# Patient Record
Sex: Male | Born: 1960 | Race: White | Hispanic: No | Marital: Married | Smoking: Former smoker
Health system: Southern US, Community
[De-identification: ages and names within clinical notes are randomized; demographics above are authoritative.]

## PROBLEM LIST (undated history)

## (undated) DIAGNOSIS — F329 Major depressive disorder, single episode, unspecified: Secondary | ICD-10-CM

## (undated) DIAGNOSIS — K279 Peptic ulcer, site unspecified, unspecified as acute or chronic, without hemorrhage or perforation: Secondary | ICD-10-CM

## (undated) DIAGNOSIS — F32A Depression, unspecified: Secondary | ICD-10-CM

## (undated) DIAGNOSIS — R7303 Prediabetes: Secondary | ICD-10-CM

## (undated) DIAGNOSIS — I10 Essential (primary) hypertension: Secondary | ICD-10-CM

## (undated) HISTORY — PX: REPLACEMENT TOTAL KNEE: SUR1224

## (undated) HISTORY — PX: TENDON REPAIR: SHX5111

## (undated) HISTORY — DX: Peptic ulcer, site unspecified, unspecified as acute or chronic, without hemorrhage or perforation: K27.9

## (undated) HISTORY — DX: Prediabetes: R73.03

## (undated) HISTORY — PX: NASAL SEPTUM SURGERY: SHX37

## (undated) HISTORY — PX: COLONOSCOPY: SHX5424

## (undated) HISTORY — DX: Essential (primary) hypertension: I10

---
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
## 2008-05-26 ENCOUNTER — Ambulatory Visit: Payer: Self-pay

## 2008-08-07 ENCOUNTER — Ambulatory Visit: Payer: Self-pay | Admitting: Cardiology

## 2009-01-17 ENCOUNTER — Emergency Department (HOSPITAL_COMMUNITY): Admission: EM | Admit: 2009-01-17 | Discharge: 2009-01-17 | Payer: Self-pay | Admitting: Emergency Medicine

## 2010-04-14 ENCOUNTER — Ambulatory Visit: Payer: Self-pay | Admitting: Surgery

## 2010-04-14 ENCOUNTER — Encounter (INDEPENDENT_AMBULATORY_CARE_PROVIDER_SITE_OTHER): Payer: Self-pay | Admitting: Orthopedic Surgery

## 2010-04-14 ENCOUNTER — Ambulatory Visit (HOSPITAL_COMMUNITY): Admission: RE | Admit: 2010-04-14 | Discharge: 2010-04-14 | Payer: Self-pay | Admitting: Orthopedic Surgery

## 2010-06-07 ENCOUNTER — Ambulatory Visit (HOSPITAL_COMMUNITY): Admission: RE | Admit: 2010-06-07 | Discharge: 2010-06-07 | Payer: Self-pay | Admitting: Orthopedic Surgery

## 2010-10-28 ENCOUNTER — Encounter: Admission: RE | Admit: 2010-10-28 | Payer: Self-pay | Source: Home / Self Care | Admitting: Endocrinology

## 2010-11-05 ENCOUNTER — Encounter
Admission: RE | Admit: 2010-11-05 | Discharge: 2010-11-05 | Payer: Self-pay | Source: Home / Self Care | Attending: Endocrinology | Admitting: Endocrinology

## 2010-11-23 ENCOUNTER — Encounter: Payer: Self-pay | Admitting: Endocrinology

## 2011-03-07 NOTE — Assessment & Plan Note (Signed)
[facility]                            CARDIOLOGY OFFICE NOTE   [name], [name]                        MRN:          [id number]  DATE:08/07/2008                            DOB:          Apr 29, 1961    Jeffrey Daniel is a pleasant 50 year old gentleman who presents for evaluation of  hypertension, hyperlipidemia and positive family history of coronary  artery disease.  Note, he has no prior cardiac history and he has also  had pericardial effusion in the 90s.  He recently underwent an adenosine  Myoview on May 26, 2008, for risk stratification.  His ejection  fraction was 56% and there was no scar or ischemia.  He has a history of  borderline hypertension and borderline hyperlipidemia.  Because of the  above issues, he wanted to be seen for further evaluation.  Note, he has  mild dyspnea with more extreme activities but there is no orthopnea,  PND, pedal edema, chest pain, palpitations, or syncope.  He had not had  claudication.  He quit smoking approximately 5 years ago.  He tries to  exercise occasionally but this does not sound to be routine thing.  He  does not follow a strict diet.   He is on no medications at present other than Tylenol as needed.   ALLERGIES:  Allergy to NONSTEROIDALS by his report.   FAMILY HISTORY:  Significant for his father had myocardial infarction at  age 44.  He also apparently had a maternal grandfather who had  myocardial infarction in his early 72s.   SOCIAL HISTORY:  He has remote history of tobacco use smoking  approximately 30 years, but he has not smoked in the past 5-6 years.  He  occasionally consumes a class of wine of beer.  He is married.  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   PAST MEDICAL HISTORY:  Significant for borderline hypertension by his  report and there has been discussion with his primary care physician  about adding medications in the past.  He also apparently has mild  hyperlipidemia.  He  has had prior nasal surgery and also prior foot  surgery.   REVIEW OF SYSTEMS:  He denies any headaches, fevers, or chills.  There  is no productive cough or hemoptysis.  There is no dysphagia,  odynophagia, melena, or hematochezia.  There is no dysuria or hematuria.  There is no seizure activity.  There is no orthopnea, PND or pedal  edema.  There is no claudication noted.  The remaining systems are  negative.   PHYSICAL EXAMINATION:  VITAL SIGNS:  Today shows a blood pressure of  149/99.  On recheck it was 140/90.  His pulse is 66.  He weighs 237  pounds.  GENERAL:  He is well-developed and mildly obese.  He is in no acute  distress at present.  SKIN:  Warm and dry.  He does not to be depressed.  There is no  peripheral clubbing.  BACK:  Normal.  HEENT:  Normal with normal eyelids.  NECK:  Supple with a normal upstroke bilaterally.  There is  no bruits  noted.  There is no jugular venous distention.  I cannot appreciate  thyromegaly.  CHEST:  Clear to auscultation.  There is no expansion.  CARDIOVASCULAR:  Regular rhythm.  Normal S1 and S2.  There are no  murmurs, rubs, or gallops noted.  There is no change of Valsalva.  ABDOMEN:  Nontender and nondistended.  Positive bowel sounds.  No  hepatosplenomegaly.  No mass appreciated.  There is no abdominal bruit.  EXTREMITIES:  He has 2+ femoral pulses bilaterally.  No bruits.  Extremity showed no edema.  I can palpate no cords.  He has 2+ dorsalis  pedis pulses bilaterally.  NEUROLOGIC:  Grossly intact.   His electrocardiogram shows sinus rhythm at a rate of 66.  There is no  RV conduction delay, but there are no ST changes.  He did have a Myoview  performed on May 26, 2008, showed an ejection fraction of 56% with no  scar or ischemia.   DIAGNOSES:  1. Borderline hypertension - the patient's blood pressure is mildly      elevated today.  We discussed the importance of lifestyle      modification.  This included an exercise  routinely as well as diet      and weight loss.  He does not have an alcohol issue.  He also has      discontinued his tobacco use previously.  After several months of      this if his blood pressure continues to run high then he will need      an antihypertensive.  He will follow up with Dr. Lenise Arena concerning      this issue.  2. Hyperlipidemia -  apparently, he had lipids drawn by Dr. Lenise Arena      recently and we will have that in 42 hours and I will review.  We      also discussed lifestyle modification concerning that issue.  This      including diet, exercise for his HDL.  3. Positive family history of coronary artery disease.  4. History of osteoarthritis.   Again, we discussed significant lifestyle modification included diet and  exercise.  He does not smoke.  I have asked him to follow up with me on  as-needed basis if he develops cardiac symptoms or needs further  management in the future.     Jeffrey Frieze Jens Som, MD, [facility]  Electronically Signed    BSC/MedQ  DD: 08/07/2008  DT: 08/07/2008  Job #: [id number]   cc:   Joycelyn Rua, M.D.

## 2011-07-01 ENCOUNTER — Emergency Department (INDEPENDENT_AMBULATORY_CARE_PROVIDER_SITE_OTHER): Payer: BC Managed Care – PPO

## 2011-07-01 ENCOUNTER — Emergency Department (HOSPITAL_BASED_OUTPATIENT_CLINIC_OR_DEPARTMENT_OTHER)
Admission: EM | Admit: 2011-07-01 | Discharge: 2011-07-01 | Disposition: A | Discharge status: Home / Self Care | Payer: BC Managed Care – PPO | Attending: Emergency Medicine | Admitting: Emergency Medicine

## 2011-07-01 ENCOUNTER — Encounter: Payer: Self-pay | Admitting: *Deleted

## 2011-07-01 DIAGNOSIS — W11XXXA Fall on and from ladder, initial encounter: Secondary | ICD-10-CM

## 2011-07-01 DIAGNOSIS — S2239XA Fracture of one rib, unspecified side, initial encounter for closed fracture: Secondary | ICD-10-CM

## 2011-07-01 DIAGNOSIS — M25519 Pain in unspecified shoulder: Secondary | ICD-10-CM | POA: Insufficient documentation

## 2011-07-01 DIAGNOSIS — R071 Chest pain on breathing: Secondary | ICD-10-CM | POA: Insufficient documentation

## 2011-07-01 DIAGNOSIS — W19XXXA Unspecified fall, initial encounter: Secondary | ICD-10-CM

## 2011-07-01 MED ORDER — OXYCODONE-ACETAMINOPHEN 5-325 MG PO TABS
1.0000 | ORAL_TABLET | Freq: Once | ORAL | Status: AC
  Administered 2011-07-01: 1 via ORAL
  Filled 2011-07-01: qty 1

## 2011-07-01 MED ORDER — HYDROCODONE-ACETAMINOPHEN 5-500 MG PO TABS: 1.0000 | ORAL_TABLET | Freq: Four times a day (QID) | ORAL | Status: AC | PRN

## 2011-07-01 NOTE — ED Notes (Signed)
Pt fell off short ladder landing on left shoulder. C/O pain to same.

## 2011-07-01 NOTE — ED Notes (Signed)
rx x 1 given for vicodin- d/c home with family to drive- ambulatory without need for assist at time of d/c

## 2011-07-01 NOTE — ED Notes (Signed)
Pt was instructed on the use of the IS and performed x 12 at 1200 cc.

## 2011-07-02 NOTE — ED Provider Notes (Signed)
History     CSN: [id number] Arrival date & time: 07/01/2011  8:52 PM  Chief Complaint  Patient presents with  . Fall   HPI Comments: 50yoM previously healthy pw Lt shoulder/back pain. Pt states he was 62ft up on ladder and slipped backward, falling onto shoulder. C/O Lt rib pain. Worse with movement and deep breathing. Denies shortness of breath, chest pain. No BHT/LOC. Denies neck pain/back pain. No numbness/tingling/weakness of extremities. Denies other complaints. Denies prefall headache/syncope/lightheadedness/cp/sob/palpitations/abd pain/n/v. Denies use of anticoagulants   Patient is a 50 y.o. male presenting with fall.  Fall    History reviewed. No pertinent past medical history.  Past Surgical History  Procedure Date  . Tendon repair   . Nasal septum surgery   . Colonoscopy     History reviewed. No pertinent family history.  History  Substance Use Topics  . Smoking status: Former Games developer  . Smokeless tobacco: Not on file  . Alcohol Use: Yes      Review of Systems  All other systems reviewed and are negative.  except as noted HPI   Physical Exam  BP 153/99  Pulse 68  Temp(Src) 99.4 F (37.4 C) (Oral)  Resp 20  Ht 6' (1.829 m)  Wt 240 lb (108.863 kg)  BMI 32.55 kg/m2  SpO2 95%  Physical Exam  Nursing note and vitals reviewed. Constitutional: He is oriented to person, place, and time. He appears well-developed and well-nourished. No distress.  HENT:  Head: Atraumatic.  Mouth/Throat: Oropharynx is clear and moist.  Eyes: Conjunctivae are normal. Pupils are equal, round, and reactive to light.  Neck: Neck supple.       No c/t/l/s ttp  Cardiovascular: Normal rate, regular rhythm, normal heart sounds and intact distal pulses.  Exam reveals no gallop and no friction rub.   No murmur heard. Pulmonary/Chest: Effort normal and breath sounds normal. No respiratory distress. He has no wheezes. He has no rales. He exhibits tenderness.       BS equal b/l    Abdominal: Soft. Bowel sounds are normal. There is no tenderness. There is no rebound and no guarding.  Musculoskeletal: Normal range of motion. He exhibits no edema and no tenderness.       +Lt posterior rib ttp, no ecchymosis  Neurological: He is alert and oriented to person, place, and time. No cranial nerve deficit. He exhibits normal muscle tone. Coordination normal.       Strength 5/5 all extremities No pronator drift No facial droop   Skin: Skin is warm and dry.  Psychiatric: He has a normal mood and affect.    ED Course  Procedures  MDM S/p mechanical fall with rib fracture. No ptx. Pain control. Incentive spirometry. Home with pmd f/u prn  Stefano Gaul, MD       Forbes Cellar, MD 07/02/11 [phone]

## 2011-07-25 ENCOUNTER — Ambulatory Visit (HOSPITAL_COMMUNITY): Payer: BC Managed Care – PPO | Attending: Orthopedic Surgery

## 2011-07-25 DIAGNOSIS — D649 Anemia, unspecified: Secondary | ICD-10-CM | POA: Insufficient documentation

## 2013-02-20 ENCOUNTER — Encounter: Payer: Self-pay | Admitting: *Deleted

## 2013-02-20 ENCOUNTER — Encounter: Payer: Self-pay | Admitting: Cardiology

## 2013-02-21 ENCOUNTER — Ambulatory Visit (INDEPENDENT_AMBULATORY_CARE_PROVIDER_SITE_OTHER): Payer: BC Managed Care – PPO | Admitting: Cardiology

## 2013-02-21 ENCOUNTER — Encounter: Payer: Self-pay | Admitting: Cardiology

## 2013-02-21 VITALS — BP 133/90 | HR 63 | Wt 253.0 lb

## 2013-02-21 DIAGNOSIS — I10 Essential (primary) hypertension: Secondary | ICD-10-CM | POA: Insufficient documentation

## 2013-02-21 DIAGNOSIS — R002 Palpitations: Secondary | ICD-10-CM

## 2013-02-21 DIAGNOSIS — R079 Chest pain, unspecified: Secondary | ICD-10-CM

## 2013-02-21 NOTE — Assessment & Plan Note (Signed)
Continue present medications. 

## 2013-02-21 NOTE — Assessment & Plan Note (Signed)
Description sounds like PACs or PVCs. Schedule echocardiogram, TSH and potassium. He is also complaining of fatigue and I will check his hemoglobin. If his symptoms worsen we'll schedule monitor.

## 2013-02-21 NOTE — Addendum Note (Signed)
Addended by: Freddi Starr on: 02/21/2013 10:19 AM   Modules accepted: Orders

## 2013-02-21 NOTE — Patient Instructions (Addendum)
Your physician wants you to follow-up in: 6 MONTHS WITH DR Jens Som You will receive a reminder letter in the mail two months in advance. If you don't receive a letter, please call our office to schedule the follow-up appointment.   Your physician has requested that you have an echocardiogram. Echocardiography is a painless test that uses sound waves to create images of your heart. It provides your doctor with information about the size and shape of your heart and how well your heart's chambers and valves are working. This procedure takes approximately one hour. There are no restrictions for this procedure.   Your physician has requested that you have an exercise tolerance test. For further information please visit [email or web address] Please also follow instruction sheet, as given.   Exercise Stress Electrocardiography An exercise stress test is a heart test (EKG) which is done while you are moving. You will walk on a treadmill. This test will tell your doctor how your heart does when it is forced to work harder and how much activity you can safely handle. BEFORE THE TEST  Wear shorts or athletic pants.  Wear comfortable tennis shoes.  Women need to wear a bra that allows patches to be put on under it. TEST  An EKG cable will be attached to your waist. This cable is hooked up to patches, which look like round stickers stuck to your chest.  You will be asked to walk on the treadmill.  You will walk until you are too tired or until you are told to stop.  Tell the doctor right away if you have:  Chest pain.  Leg cramps.  Shortness of breath.  Dizziness.  The test may last 30 minutes to 1 hour. The timing depends on your physical condition and the condition of your heart. AFTER THE TEST  You will rest for about 6 minutes. During this time, your heart rhythm and blood pressure will be checked.  The testing equipment will be removed from your body and you can get dressed.  You may  go home or back to your hospital room. You may keep doing all your usual activities as told by your doctor. Finding out the results of your test Ask when your test results will be ready. Make sure you get your test results. Document Released: 03/27/2008 Document Revised: 01/01/2012 Document Reviewed: 03/27/2008 [facility] Patient Information 2013 [place], [place].

## 2013-02-21 NOTE — Assessment & Plan Note (Signed)
Plan exercise treadmill. Symptoms atypical.

## 2013-02-21 NOTE — Progress Notes (Signed)
  HPI: Pleasant male for evaluation of chest pain. Myoview in August of 2009 showed an ejection fraction of 56% and no scar or ischemia. patient states that for the past year he has had occasional pain in the left breast area and described as a cramp. It can last up to one hour. No associated symptoms. The pain is not pleuritic, positional, exertional or related to food. It improves with taking a deep breath in sitting up. He has not had dyspnea on exertion. No syncope. Patient also has had occasional palpitations scribed as a skip. Not sustained.  Current Outpatient Prescriptions  Medication Sig Dispense Refill  . acetaminophen (TYLENOL) 325 MG tablet Take 650 mg by mouth every 6 (six) hours as needed for pain.      Marland Kitchen aspirin EC 81 MG tablet Take 81 mg by mouth daily.        . Cholecalciferol (VITAMIN D) 2000 UNITS CAPS Take 2,000 Units by mouth 2 (two) times daily.        Marland Kitchen lisinopril-hydrochlorothiazide (PRINZIDE,ZESTORETIC) 20-12.5 MG per tablet Take 1 tablet by mouth daily.      . Red Yeast Rice 600 MG CAPS Take 1 capsule by mouth every evening.        . sodium chloride (OCEAN) 0.65 % nasal spray Place 2 sprays into the nose as needed. allergies       . Testosterone (ANDROGEL PUMP) 1.25 GM/ACT (1%) GEL Place 1 Squirt onto the skin daily.         No current facility-administered medications for this visit.    Allergies  Allergen Reactions  . Nsaids Hives, Shortness Of Breath and Itching    Past Medical History  Diagnosis Date  . Hypertension     Past Surgical History  Procedure Laterality Date  . Tendon repair    . Nasal septum surgery    . Colonoscopy      History   Social History  . Marital Status: Married    Spouse Name: N/A    Number of Children: 2  . Years of Education: N/A   Occupational History  .     Social History Main Topics  . Smoking status: Former Games developer  . Smokeless tobacco: Not on file  . Alcohol Use: Yes     Comment: Occasional  . Drug Use: No  .  Sexually Active: Not on file   Other Topics Concern  . Not on file   Social History Narrative  . No narrative on file    Family History  Problem Relation Age of Onset  . CAD Father     MI at age 59  . Heart disease Son     Pulmonary atresia  . Stroke Mother     ROS: fatigue no fevers or chills, productive cough, hemoptysis, dysphasia, odynophagia, melena, hematochezia, dysuria, hematuria, rash, seizure activity, orthopnea, PND, pedal edema, claudication. Remaining systems are negative.  Physical Exam:   Blood pressure 133/90, pulse 63, weight 253 lb (114.76 kg).  General:  Well developed/well nourished in NAD Skin warm/dry Patient not depressed No peripheral clubbing Back-normal HEENT-normal/normal eyelids Neck supple/normal carotid upstroke bilaterally; no bruits; no JVD; no thyromegaly chest - CTA/ normal expansion CV - RRR/normal S1 and S2; no murmurs, rubs or gallops;  PMI nondisplaced Abdomen -NT/ND, no HSM, no mass, + bowel sounds, no bruit 2+ femoral pulses, no bruits Ext-no edema, chords, 2+ DP Neuro-grossly nonfocal  ECG NSR with no ST changes

## 2013-02-27 ENCOUNTER — Other Ambulatory Visit (INDEPENDENT_AMBULATORY_CARE_PROVIDER_SITE_OTHER): Payer: BC Managed Care – PPO

## 2013-02-27 ENCOUNTER — Ambulatory Visit (HOSPITAL_COMMUNITY): Payer: BC Managed Care – PPO | Attending: Cardiology | Admitting: Radiology

## 2013-02-27 ENCOUNTER — Other Ambulatory Visit: Payer: Self-pay

## 2013-02-27 DIAGNOSIS — E669 Obesity, unspecified: Secondary | ICD-10-CM | POA: Insufficient documentation

## 2013-02-27 DIAGNOSIS — R079 Chest pain, unspecified: Secondary | ICD-10-CM

## 2013-02-27 DIAGNOSIS — R0609 Other forms of dyspnea: Secondary | ICD-10-CM

## 2013-02-27 DIAGNOSIS — I10 Essential (primary) hypertension: Secondary | ICD-10-CM | POA: Insufficient documentation

## 2013-02-27 DIAGNOSIS — R072 Precordial pain: Secondary | ICD-10-CM

## 2013-02-27 DIAGNOSIS — R002 Palpitations: Secondary | ICD-10-CM

## 2013-02-27 LAB — BASIC METABOLIC PANEL
BUN: 24 mg/dL — ABNORMAL HIGH (ref 6–23)
CO2: 28 mEq/L (ref 19–32)
Chloride: 103 mEq/L (ref 96–112)
Glucose, Bld: 93 mg/dL (ref 70–99)
Potassium: 4.1 mEq/L (ref 3.5–5.1)
Sodium: 138 mEq/L (ref 135–145)

## 2013-02-27 LAB — CBC WITH DIFFERENTIAL/PLATELET
Basophils Absolute: 0 10*3/uL (ref 0.0–0.1)
HCT: 45.3 % (ref 39.0–52.0)
Hemoglobin: 15.5 g/dL (ref 13.0–17.0)
Lymphs Abs: 1.6 10*3/uL (ref 0.7–4.0)
MCHC: 34.1 g/dL (ref 30.0–36.0)
MCV: 89.7 fl (ref 78.0–100.0)
Monocytes Absolute: 0.6 10*3/uL (ref 0.1–1.0)
Neutro Abs: 3.9 10*3/uL (ref 1.4–7.7)
Platelets: 157 10*3/uL (ref 150.0–400.0)
RDW: 13.5 % (ref 11.5–14.6)

## 2013-02-27 LAB — TSH: TSH: 0.94 u[IU]/mL (ref 0.35–5.50)

## 2013-02-27 NOTE — Progress Notes (Signed)
Echocardiogram performed.  

## 2013-02-28 ENCOUNTER — Telehealth: Payer: Self-pay | Admitting: Cardiology

## 2013-02-28 NOTE — Telephone Encounter (Signed)
CD of Echo mailed to Pt, ROI signed  02/28/13/KM

## 2013-03-10 ENCOUNTER — Ambulatory Visit (INDEPENDENT_AMBULATORY_CARE_PROVIDER_SITE_OTHER): Payer: BC Managed Care – PPO | Admitting: Physician Assistant

## 2013-03-10 DIAGNOSIS — R079 Chest pain, unspecified: Secondary | ICD-10-CM

## 2013-03-10 DIAGNOSIS — R002 Palpitations: Secondary | ICD-10-CM

## 2013-03-10 NOTE — Progress Notes (Signed)
Exercise Treadmill Test  Pre-Exercise Testing Evaluation Rhythm: normal sinus  Rate: 68                 Test  Exercise Tolerance Test Ordering MD: Olga Millers, MD  Interpreting MD: Tereso Newcomer, PA-C  Unique Test No: 1  Treadmill:  1  Indication for ETT: chest pain - rule out ischemia  Contraindication to ETT: No   Stress Modality: exercise - treadmill  Cardiac Imaging Performed: non   Protocol: standard Bruce - maximal  Max BP:  160/68  Max MPHR (bpm):  167 85% MPR (bpm):  142  MPHR obtained (bpm):  151 % MPHR obtained:  90%  Reached 85% MPHR (min:sec):  7:31 Total Exercise Time (min-sec):  9:00  Workload in METS:  10.1 Borg Scale: 16  Reason ETT Terminated:  desired heart rate attained    ST Segment Analysis At Rest: normal ST segments - no evidence of significant ST depression With Exercise: non-specific ST changes  Other Information Arrhythmia:  ventricular couplet x 2 at max exercise Angina during ETT:  absent (0) Quality of ETT:  diagnostic  ETT Interpretation:  normal - no evidence of ischemia by ST analysis  Comments: Good exercise tolerance. No chest pain. Normal BP response to exercise. Insignificant up-sloping ST depression. No changes to suggest ischemia. Two ventricular couplets during exercise noted. Asymptomatic.    Recommendations: F/u with Dr. Olga Millers as directed. Luna Glasgow, PA-C  9:58 AM 03/10/2013

## 2013-03-14 ENCOUNTER — Telehealth: Payer: Self-pay | Admitting: *Deleted

## 2013-03-14 NOTE — Telephone Encounter (Signed)
Spoke with pt, aware of echo, gxt and lab results. He would like for dr Jens Som to compare the recent gxt with his nuclear test he had in the past. Requested the pts paper chart from medical records.

## 2013-03-20 NOTE — Telephone Encounter (Signed)
Left message for pt, dr Jens Som has reviewed all the strips from the recent gxt and myoview from 2009, everything is normal. He will call with further questions.

## 2013-07-18 ENCOUNTER — Other Ambulatory Visit: Payer: Self-pay | Admitting: Orthopedic Surgery

## 2013-07-18 DIAGNOSIS — M25561 Pain in right knee: Secondary | ICD-10-CM

## 2013-08-04 ENCOUNTER — Ambulatory Visit
Admission: RE | Admit: 2013-08-04 | Discharge: 2013-08-04 | Disposition: A | Discharge status: Home / Self Care | Payer: BC Managed Care – PPO | Source: Ambulatory Visit | Attending: Orthopedic Surgery | Admitting: Orthopedic Surgery

## 2013-08-04 DIAGNOSIS — M25561 Pain in right knee: Secondary | ICD-10-CM

## 2014-02-03 ENCOUNTER — Telehealth: Payer: Self-pay | Admitting: Cardiology

## 2014-02-03 NOTE — Telephone Encounter (Signed)
New message     Pt had a stress test with Scott.  He thinks he needs to have a follow up appt.  Is a follow up appt or further testing required?

## 2014-02-03 NOTE — Telephone Encounter (Signed)
Spoke with pt, he did not have a follow up after testing because everything was normal. His insurance has told him he needs a follow up appt He would like to see scott weaver pa again Follow up scheduled

## 2014-02-18 ENCOUNTER — Encounter (INDEPENDENT_AMBULATORY_CARE_PROVIDER_SITE_OTHER): Payer: Self-pay

## 2014-02-18 ENCOUNTER — Encounter: Payer: Self-pay | Admitting: Physician Assistant

## 2014-02-18 ENCOUNTER — Ambulatory Visit (INDEPENDENT_AMBULATORY_CARE_PROVIDER_SITE_OTHER): Payer: BC Managed Care – PPO | Admitting: Physician Assistant

## 2014-02-18 VITALS — BP 110/80 | HR 65 | Ht 72.0 in | Wt 252.0 lb

## 2014-02-18 DIAGNOSIS — R079 Chest pain, unspecified: Secondary | ICD-10-CM

## 2014-02-18 DIAGNOSIS — I10 Essential (primary) hypertension: Secondary | ICD-10-CM

## 2014-02-18 NOTE — Progress Notes (Signed)
[address], [address], [place]  [id number] Phone: [phone] Fax:  [phone]  Date:  02/18/2014   ID:  Jeffrey Daniel, DOB 12/09/1960, MRN [id number]  PCP:  No primary provider on file.  Cardiologist:  Dr. Kirk Ruths     History of Present Illness: Jeffrey Daniel is a 53 y.o. male with a history of hypertension. He was evaluated by Dr. Stanford Breed in 02/2013 for chest pain. Echocardiogram demonstrated normal LV function with moderate diastolic dysfunction. Exercise treadmill test was negative for ischemic changes.  He returns for followup.  The patient denies chest pain, shortness of breath, syncope, orthopnea, PND or significant pedal edema.    Studies:    - Echo (02/27/13):  Mild focal basal hypertrophy of the septum, EF 55-60%, normal wall motion, grade 2 diastolic dysfunction, mild LAE  - ETT (03/10/13):  No ischemia   Recent Labs: 02/27/2013: Creatinine 1.1; Hemoglobin 15.5; Potassium 4.1; TSH 0.94   Wt Readings from Last 3 Encounters:  02/18/14 252 lb (114.306 kg)  02/21/13 253 lb (114.76 kg)  07/01/11 240 lb (108.863 kg)     Past Medical History  Diagnosis Date  . Hypertension     Current Outpatient Prescriptions  Medication Sig Dispense Refill  . acetaminophen (TYLENOL) 325 MG tablet Take 650 mg by mouth every 6 (six) hours as needed for pain.      Marland Kitchen aspirin EC 81 MG tablet Take 81 mg by mouth daily.        . Cholecalciferol (VITAMIN D) 2000 UNITS CAPS Take 2,000 Units by mouth 2 (two) times daily.        Marland Kitchen lisinopril-hydrochlorothiazide (PRINZIDE,ZESTORETIC) 20-12.5 MG per tablet Take 1 tablet by mouth daily.      . MELOXICAM PO Take by mouth as needed.      . sodium chloride (OCEAN) 0.65 % nasal spray Place 2 sprays into the nose as needed. allergies       . Red Yeast Rice 600 MG CAPS Take 1 capsule by mouth every evening.        . Testosterone (ANDROGEL PUMP) 1.25 GM/ACT (1%) GEL Place 1 Squirt onto the skin daily.         No current  facility-administered medications for this visit.    Allergies:   Nsaids   Social History:  The patient  reports that he has quit smoking. He does not have any smokeless tobacco history on file. He reports that he drinks alcohol. He reports that he does not use illicit drugs.   Family History:  The patient's family history includes CAD in his father; Heart disease in his son; Stroke in his mother.   ROS:  Please see the history of present illness.      All other systems reviewed and negative.   PHYSICAL EXAM: VS:  BP 110/80  Pulse 65  Ht 6' (1.829 m)  Wt 252 lb (114.306 kg)  BMI 34.17 kg/m2 Well nourished, well developed, in no acute distress HEENT: normal Neck: no JVD Vascular: No carotid bruits bilaterally Cardiac:  normal S1, S2; RRR; no murmur Lungs:  clear to auscultation bilaterally, no wheezing, rhonchi or rales Abd: soft, nontender, no hepatomegaly Ext: no edema Skin: warm and dry Neuro:  CNs 2-12 intact, no focal abnormalities noted  EKG:  NSR, HR 65, normal axis, no ST changes     ASSESSMENT AND PLAN:  1. Chest pain:  No recurrence. Echocardiogram in 02/2013 was normal with a normal EF. ETT was negative for  ischemic changes. He has a family history of CAD. He should likely undergo stress testing every 2-3 years for risk stratification. 2. Essential hypertension: Controlled. 3. Disposition: Followup with Dr. Stanford Breed in one year.  Signed, Richardson Dopp, PA-C  02/18/2014 4:06 PM

## 2014-02-18 NOTE — Patient Instructions (Signed)
Your physician wants you to follow-up in: 1 YEAR WITH DR. CRENSHAW You will receive a reminder letter in the mail two months in advance. If you don't receive a letter, please call our office to schedule the follow-up appointment.  

## 2014-02-19 ENCOUNTER — Encounter: Payer: Self-pay | Admitting: Cardiology

## 2014-03-03 ENCOUNTER — Telehealth: Payer: Self-pay | Admitting: Cardiology

## 2014-03-03 NOTE — Telephone Encounter (Signed)
New Message:  Pt states he is reviewing his medical records and states he does not take Androgel or red yeast rice and he wants his medications updated. Pt also wants a call back from the nurse.Marland Kitchen He also wants to know if it is possible the nurse email him an updated copy of his med list so he can review it.   Lynann [email or web address]

## 2014-03-03 NOTE — Telephone Encounter (Signed)
Spoke with pt, medication list updated Copy mailed to pt at his request

## 2016-10-26 ENCOUNTER — Encounter: Payer: Self-pay | Admitting: Surgery

## 2016-10-26 DIAGNOSIS — R1032 Left lower quadrant pain: Secondary | ICD-10-CM | POA: Diagnosis not present

## 2016-11-03 DIAGNOSIS — M5442 Lumbago with sciatica, left side: Secondary | ICD-10-CM | POA: Diagnosis not present

## 2016-11-03 DIAGNOSIS — M25552 Pain in left hip: Secondary | ICD-10-CM | POA: Diagnosis not present

## 2016-11-03 DIAGNOSIS — M5441 Lumbago with sciatica, right side: Secondary | ICD-10-CM | POA: Diagnosis not present

## 2016-11-16 DIAGNOSIS — G8929 Other chronic pain: Secondary | ICD-10-CM | POA: Diagnosis not present

## 2016-11-16 DIAGNOSIS — M5441 Lumbago with sciatica, right side: Secondary | ICD-10-CM | POA: Diagnosis not present

## 2016-11-17 DIAGNOSIS — G8929 Other chronic pain: Secondary | ICD-10-CM | POA: Diagnosis not present

## 2016-11-17 DIAGNOSIS — M5441 Lumbago with sciatica, right side: Secondary | ICD-10-CM | POA: Diagnosis not present

## 2016-12-21 DIAGNOSIS — D49512 Neoplasm of unspecified behavior of left kidney: Secondary | ICD-10-CM | POA: Diagnosis not present

## 2016-12-21 DIAGNOSIS — D49511 Neoplasm of unspecified behavior of right kidney: Secondary | ICD-10-CM | POA: Diagnosis not present

## 2017-02-02 DIAGNOSIS — Z96651 Presence of right artificial knee joint: Secondary | ICD-10-CM | POA: Diagnosis not present

## 2017-02-02 DIAGNOSIS — Z471 Aftercare following joint replacement surgery: Secondary | ICD-10-CM | POA: Diagnosis not present

## 2017-02-02 DIAGNOSIS — M25561 Pain in right knee: Secondary | ICD-10-CM | POA: Diagnosis not present

## 2017-05-23 DIAGNOSIS — M79672 Pain in left foot: Secondary | ICD-10-CM | POA: Diagnosis not present

## 2017-05-23 DIAGNOSIS — M25571 Pain in right ankle and joints of right foot: Secondary | ICD-10-CM | POA: Diagnosis not present

## 2017-05-23 DIAGNOSIS — M79671 Pain in right foot: Secondary | ICD-10-CM | POA: Diagnosis not present

## 2017-05-30 DIAGNOSIS — L814 Other melanin hyperpigmentation: Secondary | ICD-10-CM | POA: Diagnosis not present

## 2017-05-30 DIAGNOSIS — L820 Inflamed seborrheic keratosis: Secondary | ICD-10-CM | POA: Diagnosis not present

## 2017-05-30 DIAGNOSIS — D225 Melanocytic nevi of trunk: Secondary | ICD-10-CM | POA: Diagnosis not present

## 2017-05-30 DIAGNOSIS — L821 Other seborrheic keratosis: Secondary | ICD-10-CM | POA: Diagnosis not present

## 2017-06-27 DIAGNOSIS — M25571 Pain in right ankle and joints of right foot: Secondary | ICD-10-CM | POA: Diagnosis not present

## 2017-06-27 DIAGNOSIS — M7671 Peroneal tendinitis, right leg: Secondary | ICD-10-CM | POA: Diagnosis not present

## 2017-07-02 DIAGNOSIS — Z0000 Encounter for general adult medical examination without abnormal findings: Secondary | ICD-10-CM | POA: Diagnosis not present

## 2017-07-02 DIAGNOSIS — E7800 Pure hypercholesterolemia, unspecified: Secondary | ICD-10-CM | POA: Diagnosis not present

## 2017-07-02 DIAGNOSIS — I10 Essential (primary) hypertension: Secondary | ICD-10-CM | POA: Diagnosis not present

## 2017-07-16 ENCOUNTER — Other Ambulatory Visit: Payer: Self-pay | Admitting: Podiatry

## 2017-07-16 DIAGNOSIS — M7671 Peroneal tendinitis, right leg: Secondary | ICD-10-CM

## 2017-07-18 ENCOUNTER — Ambulatory Visit
Admission: RE | Admit: 2017-07-18 | Discharge: 2017-07-18 | Disposition: A | Discharge status: Home / Self Care | Payer: 59 | Source: Ambulatory Visit | Attending: Podiatry | Admitting: Podiatry

## 2017-07-18 DIAGNOSIS — M7671 Peroneal tendinitis, right leg: Secondary | ICD-10-CM

## 2017-07-18 DIAGNOSIS — S96911A Strain of unspecified muscle and tendon at ankle and foot level, right foot, initial encounter: Secondary | ICD-10-CM | POA: Diagnosis not present

## 2017-08-06 DIAGNOSIS — M722 Plantar fascial fibromatosis: Secondary | ICD-10-CM | POA: Diagnosis not present

## 2017-08-10 DIAGNOSIS — M79671 Pain in right foot: Secondary | ICD-10-CM | POA: Diagnosis not present

## 2017-08-10 DIAGNOSIS — M722 Plantar fascial fibromatosis: Secondary | ICD-10-CM | POA: Diagnosis not present

## 2017-08-10 DIAGNOSIS — M7671 Peroneal tendinitis, right leg: Secondary | ICD-10-CM | POA: Diagnosis not present

## 2017-08-10 DIAGNOSIS — M79672 Pain in left foot: Secondary | ICD-10-CM | POA: Diagnosis not present

## 2017-09-07 DIAGNOSIS — R2689 Other abnormalities of gait and mobility: Secondary | ICD-10-CM | POA: Diagnosis not present

## 2017-09-24 DIAGNOSIS — M79672 Pain in left foot: Secondary | ICD-10-CM | POA: Diagnosis not present

## 2017-09-24 DIAGNOSIS — M79671 Pain in right foot: Secondary | ICD-10-CM | POA: Diagnosis not present

## 2017-09-24 DIAGNOSIS — M7671 Peroneal tendinitis, right leg: Secondary | ICD-10-CM | POA: Diagnosis not present

## 2017-09-24 DIAGNOSIS — M722 Plantar fascial fibromatosis: Secondary | ICD-10-CM | POA: Diagnosis not present

## 2017-10-10 DIAGNOSIS — M79672 Pain in left foot: Secondary | ICD-10-CM | POA: Diagnosis not present

## 2017-10-10 DIAGNOSIS — M79671 Pain in right foot: Secondary | ICD-10-CM | POA: Diagnosis not present

## 2017-10-10 DIAGNOSIS — M722 Plantar fascial fibromatosis: Secondary | ICD-10-CM | POA: Diagnosis not present

## 2017-12-10 DIAGNOSIS — M79672 Pain in left foot: Secondary | ICD-10-CM | POA: Diagnosis not present

## 2017-12-10 DIAGNOSIS — M722 Plantar fascial fibromatosis: Secondary | ICD-10-CM | POA: Diagnosis not present

## 2017-12-10 DIAGNOSIS — M7671 Peroneal tendinitis, right leg: Secondary | ICD-10-CM | POA: Diagnosis not present

## 2017-12-10 DIAGNOSIS — M79671 Pain in right foot: Secondary | ICD-10-CM | POA: Diagnosis not present

## 2018-01-03 DIAGNOSIS — M79672 Pain in left foot: Secondary | ICD-10-CM | POA: Diagnosis not present

## 2018-01-03 DIAGNOSIS — M79671 Pain in right foot: Secondary | ICD-10-CM | POA: Diagnosis not present

## 2018-01-03 DIAGNOSIS — M722 Plantar fascial fibromatosis: Secondary | ICD-10-CM | POA: Diagnosis not present

## 2018-01-11 DIAGNOSIS — D49512 Neoplasm of unspecified behavior of left kidney: Secondary | ICD-10-CM | POA: Diagnosis not present

## 2018-01-11 DIAGNOSIS — D49511 Neoplasm of unspecified behavior of right kidney: Secondary | ICD-10-CM | POA: Diagnosis not present

## 2018-07-05 DIAGNOSIS — Z125 Encounter for screening for malignant neoplasm of prostate: Secondary | ICD-10-CM | POA: Diagnosis not present

## 2018-07-05 DIAGNOSIS — I10 Essential (primary) hypertension: Secondary | ICD-10-CM | POA: Diagnosis not present

## 2018-07-05 DIAGNOSIS — E7800 Pure hypercholesterolemia, unspecified: Secondary | ICD-10-CM | POA: Diagnosis not present

## 2018-07-05 DIAGNOSIS — Z0000 Encounter for general adult medical examination without abnormal findings: Secondary | ICD-10-CM | POA: Diagnosis not present

## 2018-09-16 DIAGNOSIS — L820 Inflamed seborrheic keratosis: Secondary | ICD-10-CM | POA: Diagnosis not present

## 2018-09-16 DIAGNOSIS — L989 Disorder of the skin and subcutaneous tissue, unspecified: Secondary | ICD-10-CM | POA: Diagnosis not present

## 2018-09-16 DIAGNOSIS — D485 Neoplasm of uncertain behavior of skin: Secondary | ICD-10-CM | POA: Diagnosis not present

## 2018-10-24 DIAGNOSIS — J069 Acute upper respiratory infection, unspecified: Secondary | ICD-10-CM | POA: Diagnosis not present

## 2019-08-08 ENCOUNTER — Other Ambulatory Visit: Payer: Self-pay

## 2019-08-08 ENCOUNTER — Emergency Department (HOSPITAL_COMMUNITY): Payer: 59

## 2019-08-08 ENCOUNTER — Encounter (HOSPITAL_COMMUNITY): Payer: Self-pay

## 2019-08-08 ENCOUNTER — Emergency Department (HOSPITAL_COMMUNITY)
Admission: EM | Admit: 2019-08-08 | Discharge: 2019-08-08 | Disposition: A | Discharge status: Home / Self Care | Payer: 59 | Attending: Emergency Medicine | Admitting: Emergency Medicine

## 2019-08-08 DIAGNOSIS — Z79899 Other long term (current) drug therapy: Secondary | ICD-10-CM | POA: Insufficient documentation

## 2019-08-08 DIAGNOSIS — N50812 Left testicular pain: Secondary | ICD-10-CM | POA: Insufficient documentation

## 2019-08-08 DIAGNOSIS — Z7982 Long term (current) use of aspirin: Secondary | ICD-10-CM | POA: Insufficient documentation

## 2019-08-08 DIAGNOSIS — Z96651 Presence of right artificial knee joint: Secondary | ICD-10-CM | POA: Diagnosis not present

## 2019-08-08 DIAGNOSIS — I10 Essential (primary) hypertension: Secondary | ICD-10-CM | POA: Insufficient documentation

## 2019-08-08 DIAGNOSIS — Z87891 Personal history of nicotine dependence: Secondary | ICD-10-CM | POA: Insufficient documentation

## 2019-08-08 DIAGNOSIS — R109 Unspecified abdominal pain: Secondary | ICD-10-CM | POA: Insufficient documentation

## 2019-08-08 HISTORY — DX: Depression, unspecified: F32.A

## 2019-08-08 LAB — URINALYSIS, ROUTINE W REFLEX MICROSCOPIC
Bilirubin Urine: NEGATIVE
Glucose, UA: NEGATIVE mg/dL
Hgb urine dipstick: NEGATIVE
Ketones, ur: NEGATIVE mg/dL
Leukocytes,Ua: NEGATIVE
Nitrite: NEGATIVE
Protein, ur: NEGATIVE mg/dL
Specific Gravity, Urine: 1.029 (ref 1.005–1.030)
pH: 5 (ref 5.0–8.0)

## 2019-08-08 MED ORDER — HYDROMORPHONE HCL 1 MG/ML IJ SOLN
1.0000 mg | Freq: Once | INTRAMUSCULAR | Status: AC
  Administered 2019-08-08: 1 mg via INTRAMUSCULAR
  Filled 2019-08-08: qty 1

## 2019-08-08 MED ORDER — DIAZEPAM 5 MG PO TABS
5.0000 mg | ORAL_TABLET | Freq: Once | ORAL | Status: AC
  Administered 2019-08-08: 5 mg via ORAL
  Filled 2019-08-08: qty 1

## 2019-08-08 MED ORDER — METHOCARBAMOL 750 MG PO TABS: 750.0000 mg | ORAL_TABLET | Freq: Four times a day (QID) | ORAL | 0 refills | Status: DC

## 2019-08-08 MED ORDER — OXYCODONE-ACETAMINOPHEN 5-325 MG PO TABS: 1.0000 | ORAL_TABLET | ORAL | 0 refills | Status: DC | PRN

## 2019-08-08 NOTE — ED Triage Notes (Signed)
Patient c/o left testicular pain and is radiating into the mid lower back since this AM. Patient states he went to his PCp and was given an injection of Toradol and Zofran. Patient states the Toradol did not relieve his pain.

## 2019-08-08 NOTE — ED Notes (Signed)
Coming from PCP, states having testicular and flank pain

## 2019-08-08 NOTE — ED Provider Notes (Signed)
[facility] DEPT Provider Note   CSN: [id number] Arrival date & time: 08/08/19  1552     History   Chief Complaint Chief Complaint  Patient presents with  . Testicle Pain    HPI Jeffrey Daniel is a 58 y.o. male.     58 year old male who presents acute onset of left-sided flank pain radiating to his left groin.  Denies any dysuria or hematuria.  Flank pain is been colicky in nature and hard to find a spot discomfortable.  No prior history of kidney stones.  Went to urgent care and was medicated with Toradol and Zofran and sent here.     Past Medical History:  Diagnosis Date  . Depression   . Hypertension     Patient Active Problem List   Diagnosis Date Noted  . Chest pain 02/21/2013  . Palpitations 02/21/2013  . Essential hypertension 02/21/2013    Past Surgical History:  Procedure Laterality Date  . COLONOSCOPY    . NASAL SEPTUM SURGERY    . REPLACEMENT TOTAL KNEE Right   . TENDON REPAIR          Home Medications    Prior to Admission medications   Medication Sig Start Date End Date Taking? Authorizing Provider  acetaminophen (TYLENOL) 325 MG tablet Take 650 mg by mouth every 6 (six) hours as needed for pain.    [provider]  aspirin EC 81 MG tablet Take 81 mg by mouth daily.      [provider]  Cholecalciferol (VITAMIN D) 2000 UNITS CAPS Take 2,000 Units by mouth 2 (two) times daily.      [provider]  lisinopril-hydrochlorothiazide (PRINZIDE,ZESTORETIC) 20-12.5 MG per tablet Take 1 tablet by mouth daily.    [provider]  MELOXICAM PO Take by mouth as needed.    [provider]  sodium chloride (OCEAN) 0.65 % nasal spray Place 2 sprays into the nose as needed. allergies     [provider]    Family History Family History  Problem Relation Age of Onset  . CAD Father        MI at age 59  . Heart disease Son        Pulmonary atresia  . Stroke Mother      Social History Social History   Tobacco Use  . Smoking status: Former Research scientist (life sciences)  . Smokeless tobacco: Never Used  Substance Use Topics  . Alcohol use: Yes    Comment: Occasional  . Drug use: No     Allergies   Nsaids   Review of Systems Review of Systems  All other systems reviewed and are negative.    Physical Exam Updated Vital Signs BP (!) 160/84 (BP Location: Right Arm)   Pulse 77   Temp 98.3 F (36.8 C) (Oral)   Resp 18   Ht 1.829 m (6')   Wt 115.2 kg   SpO2 95%   BMI 34.45 kg/m   Physical Exam Vitals signs and nursing note reviewed.  Constitutional:      General: He is not in acute distress.    Appearance: Normal appearance. He is well-developed. He is not toxic-appearing.  HENT:     Head: Normocephalic and atraumatic.  Eyes:     General: Lids are normal.     Conjunctiva/sclera: Conjunctivae normal.     Pupils: Pupils are equal, round, and reactive to light.  Neck:     Musculoskeletal: Normal range of motion and neck supple.  Thyroid: No thyroid mass.     Trachea: No tracheal deviation.  Cardiovascular:     Rate and Rhythm: Normal rate and regular rhythm.     Heart sounds: Normal heart sounds. No murmur. No gallop.   Pulmonary:     Effort: Pulmonary effort is normal. No respiratory distress.     Breath sounds: Normal breath sounds. No stridor. No decreased breath sounds, wheezing, rhonchi or rales.  Abdominal:     General: Bowel sounds are normal. There is no distension.     Palpations: Abdomen is soft.     Tenderness: There is no abdominal tenderness. There is no rebound.     Hernia: There is no hernia in the left inguinal area or right inguinal area.  Genitourinary:    Penis: Circumcised.      Scrotum/Testes: Normal. Cremasteric reflex is present.  Musculoskeletal: Normal range of motion.        General: No tenderness.  Skin:    General: Skin is warm and dry.     Findings: No abrasion or rash.  Neurological:     Mental Status: He is  alert and oriented to person, place, and time.     GCS: GCS eye subscore is 4. GCS verbal subscore is 5. GCS motor subscore is 6.     Cranial Nerves: No cranial nerve deficit.     Sensory: No sensory deficit.  Psychiatric:        Speech: Speech normal.        Behavior: Behavior normal.      ED Treatments / Results  Labs (all labs ordered are listed, but only abnormal results are displayed) Labs Reviewed  URINALYSIS, ROUTINE W REFLEX MICROSCOPIC    EKG None  Radiology No results found.  Procedures Procedures (including critical care time)  Medications Ordered in ED Medications  HYDROmorphone (DILAUDID) injection 1 mg (has no administration in time range)     Initial Impression / Assessment and Plan / ED Course  I have reviewed the triage vital signs and the nursing notes.  Pertinent labs & imaging results that were available during my care of the patient were reviewed by me and considered in my medical decision making (see chart for details).        Suspect the patient may have had renal colic.  Patient had abdominal CT that was negative.  Patient also had a testicle ultrasound which was negative.  Urinalysis negative.  Medicated with hydromorphone x2.  Will discharge home  Final Clinical Impressions(s) / ED Diagnoses   Final diagnoses:  None    ED Discharge Orders    None       [name], MD 08/08/19 2148

## 2021-07-10 NOTE — Progress Notes (Signed)
No ST   Jeffrey Coe, MD Reason for referral-palpitations and dyspnea  HPI: 60 year old male for evaluation of palpitations and dyspnea at request of Zack Seal, MD. Patient seen previously but not since 2015.  Echocardiogram in 2014 showed normal LV function.  Exercise treadmill was normal.  Patient states that for the past 1 to 2 years he has had progressive dyspnea on exertion.  No orthopnea, PND, chest pain or syncope.  Occasional minimal pedal edema.  He is extremely concerned as his father had his first myocardial infarction at age 42.  Patient also has occasional palpitations described as a flutter.  These are nonsustained and there is no syncope.  He has not had any in the past 4 to 6 weeks.  Cardiology now asked to evaluate.  Current Outpatient Medications  Medication Sig Dispense Refill   acetaminophen (TYLENOL) 325 MG tablet Take 650 mg by mouth every 6 (six) hours as needed for pain.     aspirin EC 81 MG tablet Take 81 mg by mouth daily.       lisinopril-hydrochlorothiazide (PRINZIDE,ZESTORETIC) 20-12.5 MG per tablet Take 1 tablet by mouth daily.     methocarbamol (ROBAXIN-750) 750 MG tablet Take 1 tablet (750 mg total) by mouth 4 (four) times daily. (Patient not taking: Reported on 07/13/2021) 30 tablet 0   oxyCODONE-acetaminophen (PERCOCET/ROXICET) 5-325 MG tablet Take 1-2 tablets by mouth every 4 (four) hours as needed for severe pain. (Patient not taking: Reported on 07/13/2021) 15 tablet 0   No current facility-administered medications for this visit.    Allergies  Allergen Reactions   Nsaids Hives, Shortness Of Breath and Itching   Bupropion Hives   Strattera  [Atomoxetine Hcl] Other (See Comments)     Past Medical History:  Diagnosis Date   Depression    Hypertension    Peptic ulcer disease    Prediabetes     Past Surgical History:  Procedure Laterality Date   COLONOSCOPY     NASAL SEPTUM SURGERY     REPLACEMENT TOTAL KNEE Right    TENDON  REPAIR      Social History   Socioeconomic History   Marital status: Married    Spouse name: Not on file   Number of children: 2   Years of education: Not on file   Highest education level: Not on file  Occupational History    Employer: [employer]  Tobacco Use   Smoking status: Former   Smokeless tobacco: Never  Vaping Use   Vaping Use: Never used  Substance and Sexual Activity   Alcohol use: Yes    Comment: Occasional   Drug use: No   Sexual activity: Not on file  Other Topics Concern   Not on file  Social History Narrative   Not on file   Social Determinants of Health   Financial Resource Strain: Not on file  Food Insecurity: Not on file  Transportation Needs: Not on file  Physical Activity: Not on file  Stress: Not on file  Social Connections: Not on file  Intimate Partner Violence: Not on file    Family History  Problem Relation Age of Onset   Stroke Mother    Heart attack Father    CAD Father        MI at age 9   Heart disease Son        Pulmonary atresia    ROS: no fevers or chills, productive cough, hemoptysis, dysphasia, odynophagia, melena, hematochezia, dysuria, hematuria, rash, seizure activity,  orthopnea, PND, pedal edema, claudication. Remaining systems are negative.  Physical Exam:   Blood pressure 112/66, pulse 80, height 6' (1.829 m), weight 260 lb 6.4 oz (118.1 kg), SpO2 94 %.  General:  Well developed/well nourished in NAD Skin warm/dry Patient not depressed No peripheral clubbing Back-normal HEENT-normal/normal eyelids Neck supple/normal carotid upstroke bilaterally; no bruits; no JVD; no thyromegaly chest - CTA/ normal expansion CV - RRR/normal S1 and S2; no murmurs, rubs or gallops;  PMI nondisplaced Abdomen -NT/ND, no HSM, no mass, + bowel sounds, no bruit 2+ femoral pulses, no bruits Ext-no edema, chords, 2+ DP Neuro-grossly nonfocal  ECG -normal sinus rhythm at a rate of 80, changes.  Personally  reviewed  A/P  1 dyspnea-patient has noted worsening dyspnea on exertion.  I will arrange an echocardiogram to assess LV function.  He has multiple risk factors including hypertension, prediabetes mellitus, family history of coronary disease and remote tobacco abuse.  We will arrange a cardiac CTA to rule out obstructive coronary disease.  2 hypertension-blood pressure controlled.  Continue present medications and follow.  3 palpitations-etiology unclear but potentially PACs or PVCs.  We discussed purchasing an apple watch to record rhythm strips and he will consider.  His symptoms are relatively infrequent and I think we would likely miss his symptoms with a monitor.  Kirk Ruths, MD

## 2021-07-13 ENCOUNTER — Other Ambulatory Visit: Payer: Self-pay

## 2021-07-13 ENCOUNTER — Encounter: Payer: Self-pay | Admitting: Cardiology

## 2021-07-13 ENCOUNTER — Ambulatory Visit (INDEPENDENT_AMBULATORY_CARE_PROVIDER_SITE_OTHER): Payer: 59 | Admitting: Cardiology

## 2021-07-13 VITALS — BP 112/66 | HR 80 | Ht 72.0 in | Wt 260.4 lb

## 2021-07-13 DIAGNOSIS — I10 Essential (primary) hypertension: Secondary | ICD-10-CM

## 2021-07-13 DIAGNOSIS — R072 Precordial pain: Secondary | ICD-10-CM

## 2021-07-13 DIAGNOSIS — R002 Palpitations: Secondary | ICD-10-CM | POA: Diagnosis not present

## 2021-07-13 DIAGNOSIS — R0609 Other forms of dyspnea: Secondary | ICD-10-CM

## 2021-07-13 DIAGNOSIS — R0600 Dyspnea, unspecified: Secondary | ICD-10-CM

## 2021-07-13 MED ORDER — METOPROLOL TARTRATE 100 MG PO TABS: ORAL_TABLET | ORAL | 0 refills | Status: DC

## 2021-07-13 NOTE — Patient Instructions (Signed)
Testing/Procedures:  Your physician has requested that you have an echocardiogram. Echocardiography is a painless test that uses sound waves to create images of your heart. It provides your doctor with information about the size and shape of your heart and how well your heart's chambers and valves are working. This procedure takes approximately one hour. There are no restrictions for this procedure. [facility]-1ST FLOOR IMAGING DEPARTMENT   Your cardiac CT will be scheduled at   [facility] [address] [place], [place] [address] [phone]   If scheduled at [facility], please arrive at the [facility] main entrance (entrance A) of [facility] 30 minutes prior to test start time. Proceed to the [facility] Radiology Department (first floor) to check-in and test prep.   Please follow these instructions carefully (unless otherwise directed):  Hold all erectile dysfunction medications at least 3 days (72 hrs) prior to test.  On the Night Before the Test: Be sure to Drink plenty of water. Do not consume any caffeinated/decaffeinated beverages or chocolate 12 hours prior to your test. Do not take any antihistamines 12 hours prior to your test.  On the Day of the Test: Drink plenty of water until 1 hour prior to the test. Do not eat any food 4 hours prior to the test. You may take your regular medications prior to the test.  Take metoprolol (Lopressor) 100 MG two hours prior to test. HOLD Furosemide/Hydrochlorothiazide morning of the test.  After the Test: Drink plenty of water. After receiving IV contrast, you may experience a mild flushed feeling. This is normal. On occasion, you may experience a mild rash up to 24 hours after the test. This is not dangerous. If this occurs, you can take Benadryl 25 mg and increase your fluid intake. If you experience trouble breathing, this can be serious. If it is severe call 911 IMMEDIATELY. If it is  mild, please call our office. If you take any of these medications: Glipizide/Metformin, Avandament, Glucavance, please do not take 48 hours after completing test unless otherwise instructed.  Please allow 2-4 weeks for scheduling of routine cardiac CTs. Some insurance companies require a pre-authorization which may delay scheduling of this test.   For non-scheduling related questions, please contact the cardiac imaging nurse navigator should you have any questions/concerns: Marchia Bond, Cardiac Imaging Nurse Navigator Gordy Clement, Cardiac Imaging Nurse Navigator [facility] Direct Office Dial: [phone]   For scheduling needs, including cancellations and rescheduling, please call [place], [phone].    Follow-Up: At [facility], you and your health needs are our priority.  As part of our continuing mission to provide you with exceptional heart care, we have created designated Provider Care Teams.  These Care Teams include your primary Cardiologist (physician) and Advanced Practice Providers (APPs -  Physician Assistants and Nurse Practitioners) who all work together to provide you with the care you need, when you need it.  We recommend signing up for the patient portal called "MyChart".  Sign up information is provided on this After Visit Summary.  MyChart is used to connect with patients for Virtual Visits (Telemedicine).  Patients are able to view lab/test results, encounter notes, upcoming appointments, etc.  Non-urgent messages can be sent to your provider as well.   To learn more about what you can do with MyChart, go to [email or web address].    Your next appointment:   6 month(s)  The format for your next appointment:   In  Person  Provider:   Kirk Ruths, MD

## 2021-07-25 ENCOUNTER — Other Ambulatory Visit: Payer: Self-pay

## 2021-07-25 ENCOUNTER — Ambulatory Visit (HOSPITAL_BASED_OUTPATIENT_CLINIC_OR_DEPARTMENT_OTHER)
Admission: RE | Admit: 2021-07-25 | Discharge: 2021-07-25 | Disposition: A | Discharge status: Home / Self Care | Payer: 59 | Source: Ambulatory Visit | Attending: Cardiology | Admitting: Cardiology

## 2021-07-25 ENCOUNTER — Telehealth (HOSPITAL_COMMUNITY): Payer: Self-pay | Admitting: *Deleted

## 2021-07-25 DIAGNOSIS — R0609 Other forms of dyspnea: Secondary | ICD-10-CM | POA: Diagnosis present

## 2021-07-25 NOTE — Telephone Encounter (Signed)
Reaching out to patient to offer assistance regarding upcoming cardiac imaging study; pt verbalizes understanding of appt date/time, parking situation and where to check in, pre-test NPO status and medications ordered, and verified current allergies; name and call back number provided for further questions should they arise ° °Purity Irmen RN Navigator Cardiac Imaging °[facility] °[phone] office °[phone] cell  ° °Patient to take 100mg metoprolol tartrate two hours prior to cardiac CT scan. °

## 2021-07-26 LAB — ECHOCARDIOGRAM COMPLETE
AR max vel: 3.2 cm2
AV Area VTI: 2.73 cm2
AV Area mean vel: 2.91 cm2
AV Mean grad: 5 mmHg
AV Peak grad: 8.3 mmHg
Ao pk vel: 1.44 m/s
Area-P 1/2: 3.13 cm2
S' Lateral: 3 cm
Single Plane A4C EF: 66.5 %

## 2021-07-26 LAB — BASIC METABOLIC PANEL
BUN/Creatinine Ratio: 20 (ref 10–24)
BUN: 22 mg/dL (ref 8–27)
CO2: 23 mmol/L (ref 20–29)
Calcium: 9.3 mg/dL (ref 8.6–10.2)
Chloride: 100 mmol/L (ref 96–106)
Creatinine, Ser: 1.09 mg/dL (ref 0.76–1.27)
Glucose: 94 mg/dL (ref 70–99)
Potassium: 3.8 mmol/L (ref 3.5–5.2)
Sodium: 140 mmol/L (ref 134–144)
eGFR: 77 mL/min/{1.73_m2} (ref >59)

## 2021-07-27 ENCOUNTER — Encounter (HOSPITAL_COMMUNITY): Payer: Self-pay

## 2021-07-27 ENCOUNTER — Other Ambulatory Visit: Payer: Self-pay

## 2021-07-27 ENCOUNTER — Ambulatory Visit (HOSPITAL_COMMUNITY)
Admission: RE | Admit: 2021-07-27 | Discharge: 2021-07-27 | Disposition: A | Discharge status: Home / Self Care | Payer: 59 | Source: Ambulatory Visit | Attending: Cardiology | Admitting: Cardiology

## 2021-07-27 DIAGNOSIS — R072 Precordial pain: Secondary | ICD-10-CM

## 2021-07-27 MED ORDER — NITROGLYCERIN 0.4 MG SL SUBL
SUBLINGUAL_TABLET | SUBLINGUAL | Status: AC
  Filled 2021-07-27: qty 2

## 2021-07-27 MED ORDER — IOHEXOL 350 MG/ML SOLN
95.0000 mL | Freq: Once | INTRAVENOUS | Status: AC | PRN
  Administered 2021-07-27: 95 mL via INTRAVENOUS

## 2021-07-27 MED ORDER — NITROGLYCERIN 0.4 MG SL SUBL
0.8000 mg | SUBLINGUAL_TABLET | Freq: Once | SUBLINGUAL | Status: AC
  Administered 2021-07-27: 0.8 mg via SUBLINGUAL

## 2021-07-28 ENCOUNTER — Telehealth: Payer: Self-pay | Admitting: *Deleted

## 2021-07-28 DIAGNOSIS — I2510 Atherosclerotic heart disease of native coronary artery without angina pectoris: Secondary | ICD-10-CM

## 2021-07-28 MED ORDER — ROSUVASTATIN CALCIUM 40 MG PO TABS: 40.0000 mg | ORAL_TABLET | Freq: Every day | ORAL | 3 refills | Status: DC

## 2021-07-28 NOTE — Telephone Encounter (Signed)
Spoke with pt, aware of results. New script sent to the pharmacy Lab orders mailed to the pt  

## 2021-07-28 NOTE — Telephone Encounter (Signed)
-----   Message from [name], MD sent at 07/27/2021  3:45 PM EDT ----- Mild CAD; add crestor 40 mg daily; lipids and liver 12 weeks Kirk Ruths

## 2021-10-10 ENCOUNTER — Encounter: Payer: Self-pay | Admitting: Cardiology

## 2021-10-20 LAB — HEPATIC FUNCTION PANEL
ALT: 36 IU/L (ref 0–44)
AST: 24 IU/L (ref 0–40)
Albumin: 4.4 g/dL (ref 3.8–4.8)
Alkaline Phosphatase: 63 IU/L (ref 44–121)
Bilirubin Total: 0.4 mg/dL (ref 0.0–1.2)
Bilirubin, Direct: 0.15 mg/dL (ref 0.00–0.40)
Total Protein: 6.7 g/dL (ref 6.0–8.5)

## 2021-10-20 LAB — LIPID PANEL
Chol/HDL Ratio: 2.1 ratio (ref 0.0–5.0)
Cholesterol, Total: 116 mg/dL (ref 100–199)
HDL: 54 mg/dL (ref >39)
LDL Chol Calc (NIH): 49 mg/dL (ref 0–99)
Triglycerides: 61 mg/dL (ref 0–149)
VLDL Cholesterol Cal: 13 mg/dL (ref 5–40)

## 2022-01-03 NOTE — Progress Notes (Signed)
? ? ? ? ?HPI: FU palpitations and dyspnea.  Echocardiogram October 2022 showed normal LV function.  Cardiac CTA October 2022 showed calcium score 553 which was 89th percentile and mild coronary disease most significant being 25 to 49% lesion in ramus intermedius.  Since last seen the patient has dyspnea with more extreme activities but not with routine activities. It is relieved with rest. It is not associated with chest pain. There is no orthopnea, PND or pedal edema. There is no syncope or palpitations. There is no exertional chest pain. ? ? ?Current Outpatient Medications  ?Medication Sig Dispense Refill  ? acetaminophen (TYLENOL) 325 MG tablet Take 650 mg by mouth every 6 (six) hours as needed for pain.    ? aspirin EC 81 MG tablet Take 81 mg by mouth daily.      ? lisinopril-hydrochlorothiazide (PRINZIDE,ZESTORETIC) 20-12.5 MG per tablet Take 1 tablet by mouth daily.    ? rosuvastatin (CRESTOR) 40 MG tablet Take 1 tablet (40 mg total) by mouth daily. 90 tablet 3  ? ?No current facility-administered medications for this visit.  ? ? ? ?Past Medical History:  ?Diagnosis Date  ? Depression   ? Hypertension   ? Peptic ulcer disease   ? Prediabetes   ? ? ?Past Surgical History:  ?Procedure Laterality Date  ? COLONOSCOPY    ? NASAL SEPTUM SURGERY    ? REPLACEMENT TOTAL KNEE Right   ? TENDON REPAIR    ? ? ?Social History  ? ?Socioeconomic History  ? Marital status: Married  ?  Spouse name: Not on file  ? Number of children: 2  ? Years of education: Not on file  ? Highest education level: Not on file  ?Occupational History  ?  Employer: [employer]  ?Tobacco Use  ? Smoking status: Former  ? Smokeless tobacco: Never  ?Vaping Use  ? Vaping Use: Never used  ?Substance and Sexual Activity  ? Alcohol use: Yes  ?  Comment: Occasional  ? Drug use: No  ? Sexual activity: Not on file  ?Other Topics Concern  ? Not on file  ?Social History Narrative  ? Not on file  ? ?Social Determinants of Health  ? ?Financial Resource  Strain: Not on file  ?Food Insecurity: Not on file  ?Transportation Needs: Not on file  ?Physical Activity: Not on file  ?Stress: Not on file  ?Social Connections: Not on file  ?Intimate Partner Violence: Not on file  ? ? ?Family History  ?Problem Relation Age of Onset  ? Stroke Mother   ? Heart attack Father   ? CAD Father   ?     MI at age 70  ? Heart disease Son   ?     Pulmonary atresia  ? ? ?ROS: no fevers or chills, productive cough, hemoptysis, dysphasia, odynophagia, melena, hematochezia, dysuria, hematuria, rash, seizure activity, orthopnea, PND, pedal edema, claudication. Remaining systems are negative. ? ?Physical Exam: ?Well-developed well-nourished in no acute distress.  ?Skin is warm and dry.  ?HEENT is normal.  ?Neck is supple.  ?Chest is clear to auscultation with normal expansion.  ?Cardiovascular exam is regular rate and rhythm.  ?Abdominal exam nontender or distended. No masses palpated. ?Extremities show no edema. ?neuro grossly intact ? ?A/P ? ?1 coronary artery disease-mild on recent CTA.  We will continue medical therapy with aspirin and statin. ? ?2 hypertension-patient's blood pressure is controlled.  Continue present medications. ? ?3 hyperlipidemia-continue statin. ? ?4 palpitations-symptoms are reasonable at present.  Can consider addition of beta-blockade and potential monitor in the future if needed. ? ?5 history of dyspnea-echocardiogram showed preserved LV function and CTA showed mild coronary disease. ? ?Kirk Ruths, MD ? ? ? ?

## 2022-01-11 ENCOUNTER — Encounter: Payer: Self-pay | Admitting: Cardiology

## 2022-01-11 ENCOUNTER — Ambulatory Visit (INDEPENDENT_AMBULATORY_CARE_PROVIDER_SITE_OTHER): Payer: 59 | Admitting: Cardiology

## 2022-01-11 ENCOUNTER — Other Ambulatory Visit: Payer: Self-pay

## 2022-01-11 VITALS — BP 122/70 | HR 74 | Ht 72.0 in | Wt 256.1 lb

## 2022-01-11 DIAGNOSIS — R0609 Other forms of dyspnea: Secondary | ICD-10-CM

## 2022-01-11 DIAGNOSIS — I2510 Atherosclerotic heart disease of native coronary artery without angina pectoris: Secondary | ICD-10-CM

## 2022-01-11 DIAGNOSIS — R002 Palpitations: Secondary | ICD-10-CM

## 2022-01-11 DIAGNOSIS — I10 Essential (primary) hypertension: Secondary | ICD-10-CM | POA: Diagnosis not present

## 2022-01-11 MED ORDER — ROSUVASTATIN CALCIUM 40 MG PO TABS: 40.0000 mg | ORAL_TABLET | Freq: Every day | ORAL | 3 refills | Status: AC

## 2022-01-11 NOTE — Patient Instructions (Signed)

## 2022-02-18 ENCOUNTER — Other Ambulatory Visit: Payer: Self-pay

## 2022-02-18 ENCOUNTER — Emergency Department (HOSPITAL_BASED_OUTPATIENT_CLINIC_OR_DEPARTMENT_OTHER)
Admission: EM | Admit: 2022-02-18 | Discharge: 2022-02-18 | Disposition: A | Discharge status: Home / Self Care | Payer: 59 | Attending: Emergency Medicine | Admitting: Emergency Medicine

## 2022-02-18 ENCOUNTER — Encounter (HOSPITAL_BASED_OUTPATIENT_CLINIC_OR_DEPARTMENT_OTHER): Payer: Self-pay | Admitting: Obstetrics and Gynecology

## 2022-02-18 DIAGNOSIS — S6991XA Unspecified injury of right wrist, hand and finger(s), initial encounter: Secondary | ICD-10-CM | POA: Diagnosis present

## 2022-02-18 DIAGNOSIS — Z7982 Long term (current) use of aspirin: Secondary | ICD-10-CM | POA: Diagnosis not present

## 2022-02-18 DIAGNOSIS — Y92512 Supermarket, store or market as the place of occurrence of the external cause: Secondary | ICD-10-CM | POA: Insufficient documentation

## 2022-02-18 DIAGNOSIS — S61011A Laceration without foreign body of right thumb without damage to nail, initial encounter: Secondary | ICD-10-CM | POA: Diagnosis not present

## 2022-02-18 DIAGNOSIS — W268XXA Contact with other sharp object(s), not elsewhere classified, initial encounter: Secondary | ICD-10-CM | POA: Insufficient documentation

## 2022-02-18 DIAGNOSIS — I10 Essential (primary) hypertension: Secondary | ICD-10-CM | POA: Insufficient documentation

## 2022-02-18 MED ORDER — CEPHALEXIN 500 MG PO CAPS: 500.0000 mg | ORAL_CAPSULE | Freq: Two times a day (BID) | ORAL | 0 refills | Status: AC

## 2022-02-18 NOTE — Discharge Instructions (Signed)
Your history and exam today are consistent with a thumb laceration on your dominant hand.  Your tetanus shot is up-to-date based on the injury.  You were able to achieve hemostasis without further bleeding prior to arrival however after cleaning and examining the wound, we had a shared decision-making conversation and agreed to hold on suture based laceration repair due to the time since injury and appearance of the wound.  We do feel its reasonable to give you prescription for antibiotics to prevent infection and your wound was cleaned bandaged and dressed for secondary healing.  Given the low suspicion for foreign bodies or fracture, we agreed to hold on imaging as well.  Please follow-up with your primary doctor and watch for symptoms of infection.  If any symptoms are to change or worsen, please return to the emergency department.  If you continue have any other problems, consider follow-up with outpatient hand surgery team. ?

## 2022-02-18 NOTE — ED Provider Notes (Signed)
?[facility] ?Provider Note ? ? ?CSN: [id number] ?Arrival date & time: 02/18/22  1916 ? ?  ? ?History ? ?Chief Complaint  ?Patient presents with  ? Hand Injury  ? ? ?Bryley Kovacevic is a 61 y.o. male. ? ?The history is provided by the patient, the spouse and medical records. No language interpreter was used.  ?Hand Injury ?Location:  Finger ?Finger location:  R thumb ?Injury: yes   ?Time since incident:  14 hours ?Pain details:  ?  Quality:  Aching ?  Radiates to:  Does not radiate ?  Severity:  Moderate ?  Onset quality:  Sudden ?  Progression:  Improving ?Handedness:  Right-handed ?Dislocation: no   ?Foreign body present:  Unable to specify ?Tetanus status:  Up to date ?Prior injury to area:  No ?Relieved by:  Nothing ?Worsened by:  Movement ?Ineffective treatments:  None tried ?Associated symptoms: no back pain, no decreased range of motion, no fatigue, no fever, no muscle weakness, no numbness and no stiffness   ? ?  ? ?Home Medications ?Prior to Admission medications   ?Medication Sig Start Date End Date Taking? Authorizing Provider  ?acetaminophen (TYLENOL) 325 MG tablet Take 650 mg by mouth every 6 (six) hours as needed for pain.    [provider]  ?aspirin EC 81 MG tablet Take 81 mg by mouth daily.      [provider]  ?lisinopril-hydrochlorothiazide (PRINZIDE,ZESTORETIC) 20-12.5 MG per tablet Take 1 tablet by mouth daily.    [provider]  ?rosuvastatin (CRESTOR) 40 MG tablet Take 1 tablet (40 mg total) by mouth daily. 01/11/22 04/11/22  [name], MD  ?   ? ?Allergies    ?Nsaids, Bupropion, and Strattera  [atomoxetine hcl]   ? ?Review of Systems   ?Review of Systems  ?Constitutional:  Negative for chills, fatigue and fever.  ?HENT:  Negative for congestion.   ?Respiratory:  Negative for chest tightness, shortness of breath and wheezing.   ?Cardiovascular:  Negative for chest pain.  ?Gastrointestinal:  Negative for abdominal pain, constipation,  diarrhea, nausea and vomiting.  ?Genitourinary:  Negative for flank pain.  ?Musculoskeletal:  Negative for back pain and stiffness.  ?Skin:  Positive for wound. Negative for rash.  ?Neurological:  Negative for headaches.  ?Psychiatric/Behavioral:  Negative for agitation.   ?All other systems reviewed and are negative. ? ?Physical Exam ?Updated Vital Signs ?BP 117/81   Pulse 67   Temp 97.8 ?F (36.6 ?C)   Resp 16   Ht 6' (1.829 m)   Wt 116 kg   SpO2 97%   BMI 34.68 kg/m?  ?Physical Exam ?Vitals and nursing note reviewed.  ?Constitutional:   ?   General: He is not in acute distress. ?   Appearance: He is well-developed.  ?HENT:  ?   Head: Normocephalic and atraumatic.  ?   Nose: No congestion.  ?   Mouth/Throat:  ?   Mouth: Mucous membranes are moist.  ?Eyes:  ?   Conjunctiva/sclera: Conjunctivae normal.  ?Pulmonary:  ?   Effort: Pulmonary effort is normal. No respiratory distress.  ?Musculoskeletal:     ?   General: Tenderness and signs of injury present. No swelling.  ?   Right hand: Laceration and tenderness present. No bony tenderness. Normal strength. Normal capillary refill. Normal pulse.  ?   Comments: Patient has a 1 cm V-shaped laceration to the pad of the right thumb.  Hemostatic.  Tender.  ?Skin: ?   General: Skin  is warm and dry.  ?   Capillary Refill: Capillary refill takes less than 2 seconds.  ?   Findings: No erythema or rash.  ?Neurological:  ?   General: No focal deficit present.  ?   Mental Status: He is alert.  ?   Sensory: No sensory deficit.  ?   Motor: No weakness.  ?Psychiatric:     ?   Mood and Affect: Mood normal.  ? ? ?ED Results / Procedures / Treatments   ?Labs ?(all labs ordered are listed, but only abnormal results are displayed) ?Labs Reviewed - No data to display ? ?EKG ?None ? ?Radiology ?No results found. ? ?Procedures ?Procedures  ? ? ?Medications Ordered in ED ?Medications - No data to display ? ?ED Course/ Medical Decision Making/ A&P ?  ?                        ?Medical  Decision Making ?Risk ?Prescription drug management. ? ? ? ?Zymire Turnbo is a 61 y.o. right-handed male with a past medical history significant for hypertension, peptic ulcer disease, and depression who presents with right thumb injury.  According to patient, he was reaching under a shelf at the fruit area of a grocery store when he felt his finger get caught and then was cut.  He suspects it may have been a piece of metal cut his finger.  He reports immediate onset of bleeding and pain.  This happened around 8 AM this morning.  He says that he initially was not going to come in but despite him dressing at a continue to bleed for some time.  He reports his tetanus is up-to-date and it did not appear to be a dirty wound.  He reports no other preceding symptoms such as fevers, chills, chest pain, shortness of breath, nausea, vomiting, [place], chills, cough.  No other complaints reported.  He denies any other injuries. ? ?On exam, his dressing was removed and there was no evidence of further bleeding.  He has a V-shaped laceration to the pad of his right thumb.  It is is well approximated when he is lying flat but it does peel back slightly.  Otherwise intact sensation, strength, and pulses.  No other injury seen. ? ?Had a shared decision-making conversation with patient and spouse about management.  As it has been over 14 hours since his injury, we discussed repair versus washing, dressing, and letting it heal on his own.  We even offered digital block and loose sutures but patient would prefer having it washed, bandage, splinted, and dressed and he can follow-up with his PCP.  He said that if it was not bleeding he would not have come to the emergency department. ? ?After washing, I did not see evidence of foreign bodies.  We offered x-ray to rule out bony injury or foreign bodies but given the low suspicion patient did not want this either. ? ?We again discussed to watch for infection and offered to provide repair  however patient does not want to do so.   ? ?Wound was washed and cleaned and did not appear to be gaping.  It was well approximated.  Wound was dressed and wrapped and splinted so it does not bend.  Patient will follow-up with his PCP and will start Keflex for prophylactic coverage.  His tetanus is up-to-date as we discussed.  Patient also given instructions to follow-up with hand surgery if symptoms persist.  He understands return for  infection.  Patient and other questions or concerns and understood plan of care.  Patient discharged in good condition ? ? ? ? ? ? ? ? ? ? ?Final Clinical Impression(s) / ED Diagnoses ?Final diagnoses:  ?Laceration of right thumb without foreign body without damage to nail, initial encounter  ? ? ?Rx / DC Orders ?ED Discharge Orders   ? ?      Ordered  ?  cephALEXin (KEFLEX) 500 MG capsule  2 times daily       ? 02/18/22 2234  ? ?  ?  ? ?  ? ? ?Clinical Impression: ?1. Laceration of right thumb without foreign body without damage to nail, initial encounter   ? ? ?Disposition: Discharge ? ?Condition: Good ? ?I have discussed the results, Dx and Tx plan with the pt(& family if present). He/she/they expressed understanding and agree(s) with the plan. Discharge instructions discussed at great length. Strict return precautions discussed and pt &/or family have verbalized understanding of the instructions. No further questions at time of discharge.  ? ? ?New Prescriptions  ? CEPHALEXIN (KEFLEX) 500 MG CAPSULE    Take 1 capsule (500 mg total) by mouth 2 (two) times daily for 7 days.  ? ? ?Follow Up: ?Orpah Melter, MD ?[address] ?[place] [address] ?[phone] ? ? ? ? ?Iran Planas, MD ?[address] ?[address] ?[place] [address] ?[phone] ? ? ?the hand surgeon ? ?[facility] ?[address] ?[place] [address] ?[phone] ? ? ? ? ? ?  ?Souleymane Saiki, Gwenyth Allegra, MD ?02/18/22 2246 ? ?

## 2022-02-18 NOTE — ED Triage Notes (Signed)
Patient reports to the ER for a right thumb injury. Patient reports he reached underneath the produce divider and was hurt by something sharp. Patient had a laceration to the thumb, bleeding controlled at this time.  ?

## 2022-03-14 ENCOUNTER — Emergency Department (HOSPITAL_COMMUNITY): Payer: 59

## 2022-03-14 ENCOUNTER — Other Ambulatory Visit: Payer: Self-pay

## 2022-03-14 ENCOUNTER — Encounter (HOSPITAL_COMMUNITY): Payer: Self-pay | Admitting: Emergency Medicine

## 2022-03-14 ENCOUNTER — Emergency Department (HOSPITAL_COMMUNITY)
Admission: EM | Admit: 2022-03-14 | Discharge: 2022-03-14 | Disposition: A | Discharge status: Home / Self Care | Payer: 59 | Attending: Emergency Medicine | Admitting: Emergency Medicine

## 2022-03-14 DIAGNOSIS — Z79899 Other long term (current) drug therapy: Secondary | ICD-10-CM | POA: Diagnosis not present

## 2022-03-14 DIAGNOSIS — I10 Essential (primary) hypertension: Secondary | ICD-10-CM | POA: Diagnosis not present

## 2022-03-14 DIAGNOSIS — Z7982 Long term (current) use of aspirin: Secondary | ICD-10-CM | POA: Diagnosis not present

## 2022-03-14 DIAGNOSIS — B349 Viral infection, unspecified: Secondary | ICD-10-CM | POA: Diagnosis not present

## 2022-03-14 DIAGNOSIS — L509 Urticaria, unspecified: Secondary | ICD-10-CM | POA: Diagnosis not present

## 2022-03-14 DIAGNOSIS — Z20822 Contact with and (suspected) exposure to covid-19: Secondary | ICD-10-CM | POA: Insufficient documentation

## 2022-03-14 DIAGNOSIS — R0602 Shortness of breath: Secondary | ICD-10-CM | POA: Diagnosis present

## 2022-03-14 LAB — COMPREHENSIVE METABOLIC PANEL
ALT: 37 U/L (ref 0–44)
AST: 43 U/L — ABNORMAL HIGH (ref 15–41)
Albumin: 3.3 g/dL — ABNORMAL LOW (ref 3.5–5.0)
Alkaline Phosphatase: 46 U/L (ref 38–126)
Anion gap: 11 (ref 5–15)
BUN: 17 mg/dL (ref 8–23)
CO2: 23 mmol/L (ref 22–32)
Calcium: 8.2 mg/dL — ABNORMAL LOW (ref 8.9–10.3)
Chloride: 100 mmol/L (ref 98–111)
Creatinine, Ser: 1.35 mg/dL — ABNORMAL HIGH (ref 0.61–1.24)
GFR, Estimated: 59 mL/min — ABNORMAL LOW (ref >60)
Glucose, Bld: 142 mg/dL — ABNORMAL HIGH (ref 70–99)
Potassium: 3.9 mmol/L (ref 3.5–5.1)
Sodium: 134 mmol/L — ABNORMAL LOW (ref 135–145)
Total Bilirubin: 1.4 mg/dL — ABNORMAL HIGH (ref 0.3–1.2)
Total Protein: 6.1 g/dL — ABNORMAL LOW (ref 6.5–8.1)

## 2022-03-14 LAB — RESP PANEL BY RT-PCR (FLU A&B, COVID) ARPGX2
Influenza A by PCR: NEGATIVE
Influenza B by PCR: NEGATIVE
SARS Coronavirus 2 by RT PCR: NEGATIVE

## 2022-03-14 LAB — CBC WITH DIFFERENTIAL/PLATELET
Abs Immature Granulocytes: 0.07 10*3/uL (ref 0.00–0.07)
Basophils Absolute: 0 10*3/uL (ref 0.0–0.1)
Basophils Relative: 0 %
Eosinophils Absolute: 0.1 10*3/uL (ref 0.0–0.5)
Eosinophils Relative: 1 %
HCT: 43.6 % (ref 39.0–52.0)
Hemoglobin: 14.9 g/dL (ref 13.0–17.0)
Immature Granulocytes: 1 %
Lymphocytes Relative: 6 %
Lymphs Abs: 0.6 10*3/uL — ABNORMAL LOW (ref 0.7–4.0)
MCH: 30.3 pg (ref 26.0–34.0)
MCHC: 34.2 g/dL (ref 30.0–36.0)
MCV: 88.8 fL (ref 80.0–100.0)
Monocytes Absolute: 1 10*3/uL (ref 0.1–1.0)
Monocytes Relative: 10 %
Neutro Abs: 8.1 10*3/uL — ABNORMAL HIGH (ref 1.7–7.7)
Neutrophils Relative %: 82 %
Platelets: 120 10*3/uL — ABNORMAL LOW (ref 150–400)
RBC: 4.91 MIL/uL (ref 4.22–5.81)
RDW: 13.5 % (ref 11.5–15.5)
WBC: 10 10*3/uL (ref 4.0–10.5)
nRBC: 0 % (ref 0.0–0.2)

## 2022-03-14 MED ORDER — FAMOTIDINE IN NACL 20-0.9 MG/50ML-% IV SOLN
20.0000 mg | Freq: Once | INTRAVENOUS | Status: AC
  Administered 2022-03-14: 20 mg via INTRAVENOUS
  Filled 2022-03-14: qty 50

## 2022-03-14 MED ORDER — PREDNISONE 20 MG PO TABS
60.0000 mg | ORAL_TABLET | Freq: Once | ORAL | Status: AC
  Administered 2022-03-14: 60 mg via ORAL
  Filled 2022-03-14: qty 3

## 2022-03-14 MED ORDER — PREDNISONE 20 MG PO TABS: ORAL_TABLET | ORAL | 0 refills | Status: AC

## 2022-03-14 MED ORDER — SODIUM CHLORIDE 0.9 % IV BOLUS
1000.0000 mL | Freq: Once | INTRAVENOUS | Status: AC
  Administered 2022-03-14: 1000 mL via INTRAVENOUS

## 2022-03-14 NOTE — ED Notes (Signed)
Patient verbalizes understanding of discharge instructions. Opportunity for questioning and answers were provided. Armband removed by staff, pt discharged from ED. Pt ambulatory to ED waiting room. 

## 2022-03-14 NOTE — Discharge Instructions (Addendum)
Follow up with your primary care doctor within the next week to have your labs rechecked.  Monitor your blood pressure at home.  Do not start taking your lisinopril/hydrochlorothiazide into your blood pressure is at least 130 (top number).  Make sure you are drinking fluids to stay hydrated.  Take the prednisone as directed.  You can use Benadryl as needed for itching.  Return to the emergency room if you have any worsening symptoms.

## 2022-03-14 NOTE — ED Provider Notes (Signed)
Patient care was taken over from Dr. Dina Rich.  Patient had presented with some viral-like symptoms of fever, nausea and diarrhea for the last few days.  He woke up during the night last night with hives.  He did have some shortness of breath but denies any facial swelling or tongue.  No new exposures.  He was treated with epinephrine and Benadryl by EMS given the shortness of breath and some associated hypotension.  His hives have essentially resolved.  He was given IV fluids.  He is overall feeling better.  Labs were obtained.  His creatinine is mildly elevated.  His glucose is mildly elevated and his LFTs are minimally elevated.  He had a chest x-ray which did not show evidence of pneumonia.  His COVID and flu were negative.  He has no ongoing shortness of breath or itching.  He was monitored for a full 4 hours following the epinephrine.  No return of symptoms.  He likely has a viral type syndrome.  It is unclear whether the hives are related to the viral syndrome or he had some sort of exposure.  Nothing was identified.  He was discharged home in good condition.  He was given a prescription for 4-day course of prednisone starting tomorrow.  He will continue to use Benadryl as needed for itching.  His blood pressure is in the low 100s.  On my reexam, it was 106/80.  Given this, I advised him to hold his blood pressure medicine until his blood pressure is more stable.  Also advised him to have close follow-up with his primary care doctor to recheck his blood work.  Return precautions were given.   Jeffrey Johns, MD 03/14/22 1025

## 2022-03-14 NOTE — ED Triage Notes (Signed)
Pt BIB EMS from home states that pt had nausea for past couple days. Pt woke up this morning with ShOB, rash all over and dizziness. Given IM epi and benadryl. EMS reports initial BP 90/50. Pt now reports feeling lightheaded, denies ShOB.

## 2022-03-14 NOTE — ED Provider Notes (Signed)
[facility] EMERGENCY DEPARTMENT Provider Note   CSN: [id number] Arrival date & time: 03/14/22  0547     History  Chief Complaint  Patient presents with   Shortness of Breath    Jeffrey Daniel is a 61 y.o. male.  HPI     This is a 61 year old male with a history of hypertension who presents with hives.  Patient reports he has not been feeling well since Saturday.  He developed nausea on Saturday and subsequently had diarrhea and chills on Sunday and Monday.  No known sick contacts.  They report temperature at home up to 101.  Patient went to bed last night woke up this morning with diffuse hives.  He did state that he noted some shortness of breath.  No recent coughs.  He was evaluated by EMS.  He was given an EpiPen and Benadryl.  Hives have since resolved.  He denies any new exposures including foods, soaps, detergents, medications.  Home Medications Prior to Admission medications   Medication Sig Start Date End Date Taking? Authorizing Provider  acetaminophen (TYLENOL) 325 MG tablet Take 650 mg by mouth every 6 (six) hours as needed for pain.    [provider]  aspirin EC 81 MG tablet Take 81 mg by mouth daily.      [provider]  lisinopril-hydrochlorothiazide (PRINZIDE,ZESTORETIC) 20-12.5 MG per tablet Take 1 tablet by mouth daily.    [provider]  rosuvastatin (CRESTOR) 40 MG tablet Take 1 tablet (40 mg total) by mouth daily. 01/11/22 04/11/22  [name], MD      Allergies    Nsaids, Bupropion, and Strattera  [atomoxetine hcl]    Review of Systems   Review of Systems  Constitutional:  Positive for fever.  Gastrointestinal:  Positive for diarrhea and nausea.  Skin:  Positive for rash.  All other systems reviewed and are negative.  Physical Exam Updated Vital Signs BP 110/63   Pulse 83   Temp 99.9 F (37.7 C) (Oral)   Resp 16   Ht 1.829 m (6')   Wt 116 kg   SpO2 95%   BMI 34.68 kg/m  Physical  Exam Vitals and nursing note reviewed.  Constitutional:      Appearance: He is well-developed.  HENT:     Head: Normocephalic and atraumatic.     Mouth/Throat:     Mouth: Mucous membranes are dry.  Eyes:     Pupils: Pupils are equal, round, and reactive to light.  Cardiovascular:     Rate and Rhythm: Normal rate and regular rhythm.     Heart sounds: Normal heart sounds. No murmur heard. Pulmonary:     Effort: Pulmonary effort is normal. No respiratory distress.     Breath sounds: Normal breath sounds. No wheezing.  Abdominal:     General: Bowel sounds are normal.     Palpations: Abdomen is soft.     Tenderness: There is no abdominal tenderness. There is no rebound.  Musculoskeletal:     Cervical back: Neck supple.  Lymphadenopathy:     Cervical: No cervical adenopathy.  Skin:    General: Skin is warm and dry.     Comments: No hives noted  Neurological:     Mental Status: He is alert and oriented to person, place, and time.  Psychiatric:        Mood and Affect: Mood normal.    ED Results / Procedures / Treatments   Labs (all labs ordered are listed, but  only abnormal results are displayed) Labs Reviewed  RESP PANEL BY RT-PCR (FLU A&B, COVID) ARPGX2  CBC WITH DIFFERENTIAL/PLATELET  COMPREHENSIVE METABOLIC PANEL    EKG EKG Interpretation  Date/Time:  Tuesday Mar 14 2022 05:52:23 EDT Ventricular Rate:  83 PR Interval:  167 QRS Duration: 103 QT Interval:  384 QTC Calculation: 452 R Axis:   -17 Text Interpretation: Sinus rhythm Probable left atrial enlargement Borderline left axis deviation RSR' in V1 or V2, right VCD or RVH Confirmed by Thayer Jew [phone]) on 03/14/2022 6:01:14 AM  Radiology DG Chest Portable 1 View  Result Date: 03/14/2022 CLINICAL DATA:  Shortness of breath.  Body aches. EXAM: PORTABLE CHEST 1 VIEW COMPARISON:  None Available. FINDINGS: 0616 hours. The lungs are clear without focal pneumonia, edema, pneumothorax or pleural effusion. The  cardiopericardial silhouette is within normal limits for size. The visualized bony structures of the thorax are unremarkable. Telemetry leads overlie the chest. IMPRESSION: No active disease. Electronically Signed   By: Misty Stanley M.D.   On: 03/14/2022 06:39    Procedures Procedures    Medications Ordered in ED Medications  famotidine (PEPCID) IVPB 20 mg premix (0 mg Intravenous Stopped 03/14/22 0712)  predniSONE (DELTASONE) tablet 60 mg (60 mg Oral Given 03/14/22 4034)    ED Course/ Medical Decision Making/ A&P                           Medical Decision Making Amount and/or Complexity of Data Reviewed Labs: ordered. Radiology: ordered.  Risk Prescription drug management.   This patient presents to the ED for concern of hives, shortness of breath, this involves an extensive number of treatment options, and is a complaint that carries with it a high risk of complications and morbidity.  I considered the following differential and admission for this acute, potentially life threatening condition.  The differential diagnosis includes anaphylaxis, viral induced urticaria, dehydration  MDM:    This is a 61 year old male who presents after waking up with shortness of breath and hives.  No obvious culprit allergen.  He has recently had symptoms suggestive of viral illness.  He was given IM epinephrine and Benadryl by EMS.  He has had complete effervescence of his rash.  Most recently he has had diarrhea and some shortness of breath.  Patient was given fluids.  He was given Pepcid and prednisone.  He will be monitored for approximately 4 hours given administration of epinephrine.  I suspect his hives were related to viral illness although anaphylaxis cannot be excluded.  Unknown culprit allergen.  Labs ordered to assess for hydration status and metabolic derangements.  Patient signed out to oncoming provider.  (Labs, imaging, consults)  Labs: I Ordered, and personally interpreted labs.  The  pertinent results include: CBC, BMP  Imaging Studies ordered: I ordered imaging studies including chest x-ray without evidence of pneumonia I independently visualized and interpreted imaging. I agree with the radiologist interpretation  Additional history obtained from wife at the bedside.  External records from outside source obtained and reviewed including recent evaluation  Cardiac Monitoring: The patient was maintained on a cardiac monitor.  I personally viewed and interpreted the cardiac monitored which showed an underlying rhythm of: Normal sinus rhythm  Reevaluation: After the interventions noted above, I reevaluated the patient and found that they have :improved  Social Determinants of Health: Lives independently  Disposition: Pending  Co morbidities that complicate the patient evaluation  Past Medical History:  Diagnosis Date  Depression    Hypertension    Peptic ulcer disease    Prediabetes      Medicines Meds ordered this encounter  Medications   famotidine (PEPCID) IVPB 20 mg premix   predniSONE (DELTASONE) tablet 60 mg    I have reviewed the patients home medicines and have made adjustments as needed  Problem List / ED Course: Problem List Items Addressed This Visit   None Visit Diagnoses     Viral illness    -  Primary   Hives                       Final Clinical Impression(s) / ED Diagnoses Final diagnoses:  Viral illness  Hives    Rx / DC Orders ED Discharge Orders     None         Merryl Hacker, MD 03/14/22 [phone]

## 2023-01-05 ENCOUNTER — Telehealth: Payer: Self-pay | Admitting: *Deleted

## 2023-01-05 NOTE — Telephone Encounter (Signed)
   Pre-operative Risk Assessment    Patient Name: Jeffrey Daniel  DOB: Mar 16, 1961 MRN: [id number]      Request for Surgical Clearance    Procedure:   CATARACT EXTRACTION W/ INTRAOCULAR LENS IMPLANTATION OF THE RIGHT EYE FOLLOWED BY THE LEFT EYE  Date of Surgery:  Clearance TBD                                 Surgeon:  DR. LISA SUN Surgeon's Group or Practice Name:  [facility] Phone number:  [phone] Fax number:  [phone]   Type of Clearance Requested:   - Medical    Type of Anesthesia:  Not Indicated   Additional requests/questions:    Astrid Divine   01/05/2023, 12:32 PM

## 2023-01-05 NOTE — Telephone Encounter (Signed)
   Patient Name: Jeffrey Daniel  DOB: 11-16-1960 MRN: [id number]  Primary Cardiologist: None  Chart reviewed as part of pre-operative protocol coverage. Cataract extractions are recognized in guidelines as low risk surgeries that do not typically require specific preoperative testing or holding of blood thinner therapy. Therefore, given past medical history and time since last visit, based on ACC/AHA guidelines, [name] would be at acceptable risk for the planned procedure without further cardiovascular testing.   I will route this recommendation to the requesting party via Epic fax function and remove from pre-op pool.  Please call with questions.  Lenna Sciara, NP 01/05/2023, 12:48 PM

## 2023-02-12 NOTE — Progress Notes (Signed)
HPI: FU palpitations and dyspnea.  Echocardiogram October 2022 showed normal LV function.  Cardiac CTA October 2022 showed calcium score 553 which was 89th percentile and mild coronary disease most significant being 25 to 49% lesion in ramus intermedius.  Since last seen he has dyspnea with more vigorous activities but not routine activities.  No orthopnea, PND, pedal edema or chest pain.  He does describe some tingling in his right upper extremity which improves with raising his arm.  Current Outpatient Medications  Medication Sig Dispense Refill   acetaminophen (TYLENOL) 325 MG tablet Take 650 mg by mouth every 6 (six) hours as needed for pain.     amphetamine-dextroamphetamine (ADDERALL XR) 25 MG 24 hr capsule Take 25 mg by mouth every morning.     aspirin EC 81 MG tablet Take 81 mg by mouth daily.       losartan (COZAAR) 25 MG tablet Take 25 mg by mouth daily.     lisinopril-hydrochlorothiazide (PRINZIDE,ZESTORETIC) 20-12.5 MG per tablet Take 1 tablet by mouth daily. (Patient not taking: Reported on 02/19/2023)     predniSONE (DELTASONE) 20 MG tablet 2 tabs po daily x 4 days (Patient not taking: Reported on 02/19/2023) 8 tablet 0   rosuvastatin (CRESTOR) 40 MG tablet Take 1 tablet (40 mg total) by mouth daily. 90 tablet 3   No current facility-administered medications for this visit.     Past Medical History:  Diagnosis Date   Depression    Hypertension    Peptic ulcer disease    Prediabetes     Past Surgical History:  Procedure Laterality Date   COLONOSCOPY     NASAL SEPTUM SURGERY     REPLACEMENT TOTAL KNEE Right    TENDON REPAIR      Social History   Socioeconomic History   Marital status: Married    Spouse name: Not on file   Number of children: 2   Years of education: Not on file   Highest education level: Not on file  Occupational History    Employer: [employer]  Tobacco Use   Smoking status: Former   Smokeless tobacco: Never  Vaping Use    Vaping Use: Never used  Substance and Sexual Activity   Alcohol use: Yes    Comment: Occasional   Drug use: No   Sexual activity: Yes  Other Topics Concern   Not on file  Social History Narrative   Not on file   Social Determinants of Health   Financial Resource Strain: Not on file  Food Insecurity: Not on file  Transportation Needs: Not on file  Physical Activity: Not on file  Stress: Not on file  Social Connections: Not on file  Intimate Partner Violence: Not on file    Family History  Problem Relation Age of Onset   Stroke Mother    Heart attack Father    CAD Father        MI at age 73   Heart disease Son        Pulmonary atresia    ROS: no fevers or chills, productive cough, hemoptysis, dysphasia, odynophagia, melena, hematochezia, dysuria, hematuria, rash, seizure activity, orthopnea, PND, pedal edema, claudication. Remaining systems are negative.  Physical Exam: Well-developed well-nourished in no acute distress.  Skin is warm and dry.  HEENT is normal.  Neck is supple.  Chest is clear to auscultation with normal expansion.  Cardiovascular exam is regular rate and rhythm.  Abdominal exam nontender or distended. No  masses palpated. Extremities show no edema. neuro grossly intact  ECG-normal sinus rhythm at a rate of 78, cannot rule out inferior infarct.  Personally reviewed  A/P  1 coronary artery disease-mild on previous CTA.  Continue aspirin and statin.  He denies chest pain.  2 hypertension-blood pressure controlled.  Continue present medical regimen.  Check potassium and renal function.  3 hyperlipidemia-continue statin.  Check lipids and liver.  4 palpitations-no significant symptoms since last office visit.  Can consider beta-blockade in the future if needed.  5 fatigue-check hemoglobin and TSH.  Olga Millers, MD

## 2023-02-19 ENCOUNTER — Encounter: Payer: Self-pay | Admitting: Cardiology

## 2023-02-19 ENCOUNTER — Ambulatory Visit: Payer: 59 | Attending: Cardiology | Admitting: Cardiology

## 2023-02-19 VITALS — BP 120/64 | HR 78 | Ht 72.0 in | Wt 249.0 lb

## 2023-02-19 DIAGNOSIS — R5383 Other fatigue: Secondary | ICD-10-CM

## 2023-02-19 DIAGNOSIS — I10 Essential (primary) hypertension: Secondary | ICD-10-CM | POA: Diagnosis not present

## 2023-02-19 DIAGNOSIS — I2510 Atherosclerotic heart disease of native coronary artery without angina pectoris: Secondary | ICD-10-CM

## 2023-02-19 DIAGNOSIS — E7800 Pure hypercholesterolemia, unspecified: Secondary | ICD-10-CM

## 2023-02-19 NOTE — Patient Instructions (Signed)
  Follow-Up: At [facility], you and your health needs are our priority.  As part of our continuing mission to provide you with exceptional heart care, we have created designated Provider Care Teams.  These Care Teams include your primary Cardiologist (physician) and Advanced Practice Providers (APPs -  Physician Assistants and Nurse Practitioners) who all work together to provide you with the care you need, when you need it.  We recommend signing up for the patient portal called "MyChart".  Sign up information is provided on this After Visit Summary.  MyChart is used to connect with patients for Virtual Visits (Telemedicine).  Patients are able to view lab/test results, encounter notes, upcoming appointments, etc.  Non-urgent messages can be sent to your provider as well.   To learn more about what you can do with MyChart, go to [email or web address].    Your next appointment:   12 month(s)  Provider:   Brian Crenshaw MD    

## 2023-02-20 LAB — COMPREHENSIVE METABOLIC PANEL
ALT: 34 IU/L (ref 0–44)
AST: 29 IU/L (ref 0–40)
Albumin/Globulin Ratio: 2.1 (ref 1.2–2.2)
Albumin: 4.6 g/dL (ref 3.9–4.9)
Alkaline Phosphatase: 62 IU/L (ref 44–121)
BUN/Creatinine Ratio: 19 (ref 10–24)
BUN: 21 mg/dL (ref 8–27)
Bilirubin Total: 0.5 mg/dL (ref 0.0–1.2)
CO2: 29 mmol/L (ref 20–29)
Calcium: 10.1 mg/dL (ref 8.6–10.2)
Chloride: 101 mmol/L (ref 96–106)
Creatinine, Ser: 1.13 mg/dL (ref 0.76–1.27)
Globulin, Total: 2.2 g/dL (ref 1.5–4.5)
Glucose: 87 mg/dL (ref 70–99)
Potassium: 3.8 mmol/L (ref 3.5–5.2)
Sodium: 143 mmol/L (ref 134–144)
Total Protein: 6.8 g/dL (ref 6.0–8.5)
eGFR: 73 mL/min/{1.73_m2} (ref >59)

## 2023-02-20 LAB — CBC
Hematocrit: 48.7 % (ref 37.5–51.0)
Hemoglobin: 15.7 g/dL (ref 13.0–17.7)
MCH: 29.6 pg (ref 26.6–33.0)
MCHC: 32.2 g/dL (ref 31.5–35.7)
MCV: 92 fL (ref 79–97)
Platelets: 200 10*3/uL (ref 150–450)
RBC: 5.3 x10E6/uL (ref 4.14–5.80)
RDW: 13.1 % (ref 11.6–15.4)
WBC: 7.3 10*3/uL (ref 3.4–10.8)

## 2023-02-20 LAB — TSH: TSH: 0.793 u[IU]/mL (ref 0.450–4.500)

## 2023-02-20 LAB — LIPID PANEL
Chol/HDL Ratio: 2.1 ratio (ref 0.0–5.0)
Cholesterol, Total: 130 mg/dL (ref 100–199)
HDL: 63 mg/dL (ref >39)
LDL Chol Calc (NIH): 51 mg/dL (ref 0–99)
Triglycerides: 79 mg/dL (ref 0–149)
VLDL Cholesterol Cal: 16 mg/dL (ref 5–40)

## 2023-02-22 ENCOUNTER — Encounter: Payer: Self-pay | Admitting: *Deleted

## 2023-03-11 IMAGING — DX DG CHEST 1V PORT
1 series · 1 of 1 positions shown · non-contrast
Comparison: None Available.

CLINICAL DATA: Shortness of breath.  Body aches.

EXAM:
PORTABLE CHEST 1 VIEW

[chest ap]
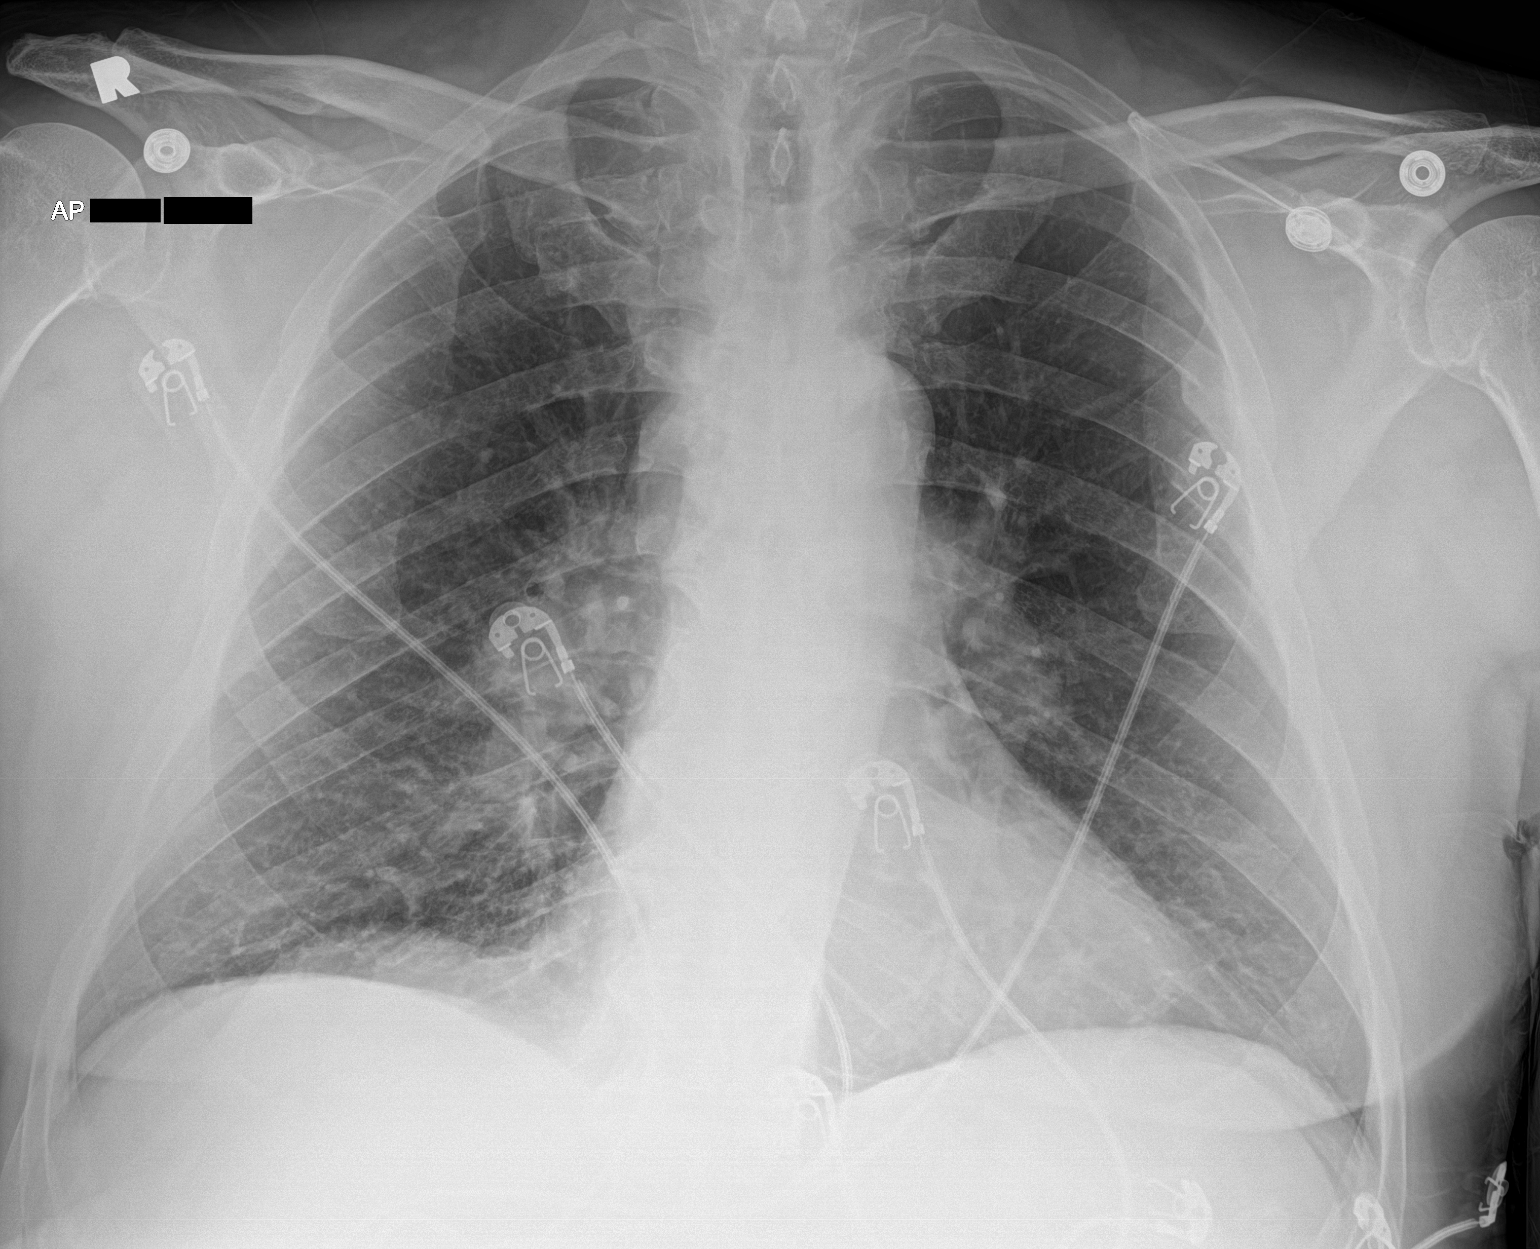

[1 of 1 positions shown; findings below may reference images not displayed]

FINDINGS: 1080 hours. The lungs are clear without focal pneumonia, edema,
pneumothorax or pleural effusion. The cardiopericardial silhouette
is within normal limits for size. The visualized bony structures of
the thorax are unremarkable. Telemetry leads overlie the chest.
IMPRESSION: No active disease.

## 2023-12-12 ENCOUNTER — Encounter: Payer: Self-pay | Admitting: *Deleted

## 2023-12-12 ENCOUNTER — Telehealth: Payer: Self-pay | Admitting: Cardiology

## 2023-12-12 DIAGNOSIS — E7800 Pure hypercholesterolemia, unspecified: Secondary | ICD-10-CM

## 2023-12-12 DIAGNOSIS — I10 Essential (primary) hypertension: Secondary | ICD-10-CM

## 2023-12-12 LAB — BASIC METABOLIC PANEL: EGFR: 68

## 2023-12-12 NOTE — Telephone Encounter (Signed)
Patient is requesting call or mychart message to discuss if labs will be needed at his next appt. Please advise.

## 2023-12-12 NOTE — Telephone Encounter (Signed)
My chart message sent to patient with dr Ludwig Clarks recommendations. Lab orders mailed to the pt

## 2023-12-25 ENCOUNTER — Encounter: Payer: Self-pay | Admitting: *Deleted

## 2023-12-26 ENCOUNTER — Encounter: Payer: Self-pay | Admitting: *Deleted

## 2023-12-26 DIAGNOSIS — E876 Hypokalemia: Secondary | ICD-10-CM

## 2023-12-26 MED ORDER — POTASSIUM CHLORIDE CRYS ER 20 MEQ PO TBCR: 20.0000 meq | EXTENDED_RELEASE_TABLET | Freq: Every day | ORAL | 3 refills | Status: DC

## 2023-12-26 MED ORDER — POTASSIUM CHLORIDE CRYS ER 20 MEQ PO TBCR: 20.0000 meq | EXTENDED_RELEASE_TABLET | Freq: Every day | ORAL | 0 refills | Status: DC

## 2023-12-26 NOTE — Addendum Note (Signed)
 Addended by: Freddi Starr on: 12/26/2023 02:06 PM   Modules accepted: Orders

## 2024-01-18 LAB — BASIC METABOLIC PANEL WITH GFR
BUN/Creatinine Ratio: 20 (ref 10–24)
BUN: 21 mg/dL (ref 8–27)
CO2: 25 mmol/L (ref 20–29)
Calcium: 9.2 mg/dL (ref 8.6–10.2)
Chloride: 104 mmol/L (ref 96–106)
Creatinine, Ser: 1.04 mg/dL (ref 0.76–1.27)
Glucose: 108 mg/dL — ABNORMAL HIGH (ref 70–99)
Potassium: 4.1 mmol/L (ref 3.5–5.2)
Sodium: 143 mmol/L (ref 134–144)
eGFR: 80 mL/min/{1.73_m2} (ref >59)

## 2024-01-21 ENCOUNTER — Encounter: Payer: Self-pay | Admitting: *Deleted

## 2024-01-21 ENCOUNTER — Encounter: Payer: Self-pay | Admitting: Cardiology

## 2024-01-21 DIAGNOSIS — I10 Essential (primary) hypertension: Secondary | ICD-10-CM

## 2024-01-21 MED ORDER — LOSARTAN POTASSIUM 100 MG PO TABS: 100.0000 mg | ORAL_TABLET | Freq: Every day | ORAL | 3 refills | Status: DC

## 2024-01-21 MED ORDER — LOSARTAN POTASSIUM 50 MG PO TABS: 50.0000 mg | ORAL_TABLET | Freq: Every day | ORAL | 3 refills | Status: DC

## 2024-01-21 NOTE — Addendum Note (Signed)
 Addended by: Freddi Starr on: 01/21/2024 04:56 PM   Modules accepted: Orders

## 2024-01-22 MED ORDER — LOSARTAN POTASSIUM 100 MG PO TABS: 100.0000 mg | ORAL_TABLET | Freq: Every day | ORAL | 3 refills | Status: AC

## 2024-01-22 NOTE — Addendum Note (Signed)
 Addended by: Freddi Starr on: 01/22/2024 08:46 AM   Modules accepted: Orders

## 2024-02-16 LAB — BASIC METABOLIC PANEL WITH GFR
BUN/Creatinine Ratio: 22 (ref 10–24)
BUN: 25 mg/dL (ref 8–27)
CO2: 26 mmol/L (ref 20–29)
Calcium: 10 mg/dL (ref 8.6–10.2)
Chloride: 104 mmol/L (ref 96–106)
Creatinine, Ser: 1.16 mg/dL (ref 0.76–1.27)
Glucose: 99 mg/dL (ref 70–99)
Potassium: 4.2 mmol/L (ref 3.5–5.2)
Sodium: 144 mmol/L (ref 134–144)
eGFR: 70 mL/min/{1.73_m2} (ref >59)

## 2024-02-18 ENCOUNTER — Encounter: Payer: Self-pay | Admitting: *Deleted

## 2024-03-13 NOTE — Progress Notes (Signed)
 HPI: FU CAD, palpitations and dyspnea. Echocardiogram October 2022 showed normal LV function. Cardiac CTA October 2022 showed calcium  score 553 which was 89th percentile and mild coronary disease most significant being 25 to 49% lesion in ramus intermedius. Since last seen patient has dyspnea with more vigorous activities but not routine activities.  No orthopnea, PND, pedal edema, chest pain or syncope.  Current Outpatient Medications  Medication Sig Dispense Refill   acetaminophen  (TYLENOL ) 325 MG tablet Take 650 mg by mouth every 6 (six) hours as needed for pain.     amphetamine-dextroamphetamine (ADDERALL XR) 25 MG 24 hr capsule Take 25 mg by mouth every morning.     aspirin EC 81 MG tablet Take 81 mg by mouth daily.       losartan  (COZAAR ) 100 MG tablet Take 1 tablet (100 mg total) by mouth daily. 90 tablet 3   hydrochlorothiazide (HYDRODIURIL) 25 MG tablet Take 1 tablet (25 mg total) by mouth daily. (Patient not taking: Reported on 03/27/2024)     potassium chloride  SA (KLOR-CON  M) 20 MEQ tablet Take 1 tablet (20 mEq total) by mouth daily. 30 tablet 0   predniSONE  (DELTASONE ) 20 MG tablet 2 tabs po daily x 4 days (Patient not taking: Reported on 02/19/2023) 8 tablet 0   rosuvastatin  (CRESTOR ) 40 MG tablet Take 1 tablet (40 mg total) by mouth daily. 90 tablet 3   No current facility-administered medications for this visit.     Past Medical History:  Diagnosis Date   Depression    Hypertension    Peptic ulcer disease    Prediabetes     Past Surgical History:  Procedure Laterality Date   COLONOSCOPY     NASAL SEPTUM SURGERY     REPLACEMENT TOTAL KNEE Right    TENDON REPAIR      Social History   Socioeconomic History   Marital status: Married    Spouse name: Not on file   Number of children: 2   Years of education: Not on file   Highest education level: Not on file  Occupational History    Employer: [employer]  Tobacco Use   Smoking status: Former    Smokeless tobacco: Never  Vaping Use   Vaping status: Never Used  Substance and Sexual Activity   Alcohol use: Yes    Comment: Occasional   Drug use: No   Sexual activity: Yes  Other Topics Concern   Not on file  Social History Narrative   Not on file   Social Drivers of Health   Financial Resource Strain: Not on file  Food Insecurity: Not on file  Transportation Needs: Not on file  Physical Activity: Not on file  Stress: Not on file  Social Connections: Not on file  Intimate Partner Violence: Not on file    Family History  Problem Relation Age of Onset   Stroke Mother    Heart attack Father    CAD Father        MI at age 76   Heart disease Son        Pulmonary atresia    ROS: no fevers or chills, productive cough, hemoptysis, dysphasia, odynophagia, melena, hematochezia, dysuria, hematuria, rash, seizure activity, orthopnea, PND, pedal edema, claudication. Remaining systems are negative.  Physical Exam: Well-developed well-nourished in no acute distress.  Skin is warm and dry.  HEENT is normal.  Neck is supple.  Chest is clear to auscultation with normal expansion.  Cardiovascular exam is regular  rate and rhythm.  Abdominal exam nontender or distended. No masses palpated.  Positive bruit. Extremities show no edema. neuro grossly intact  EKG Interpretation Date/Time:  Thursday March 27 2024 08:25:28 EDT Ventricular Rate:  86 PR Interval:  144 QRS Duration:  82 QT Interval:  374 QTC Calculation: 447 R Axis:   -28  Text Interpretation: Normal sinus rhythm Normal ECG Confirmed by Alexandria Angel ([id number]) on 03/27/2024 8:26:01 AM    A/P  1 coronary artery disease-mild on previous CTA.  He continues to deny chest pain.  Plan medical therapy with aspirin and statin.  2 hypertension-patient's blood pressure is controlled.  Continue present medications.  3 hyperlipidemia-continue statin.  LDL yesterday 55 with normal liver functions.  4 palpitations-no further  symptoms.  Will follow.  5 abdominal bruit-schedule ultrasound to rule out aneurysm.  Alexandria Angel, MD

## 2024-03-27 ENCOUNTER — Ambulatory Visit: Payer: Self-pay | Admitting: Cardiology

## 2024-03-27 ENCOUNTER — Ambulatory Visit: Payer: 59 | Attending: Cardiology | Admitting: Cardiology

## 2024-03-27 ENCOUNTER — Encounter: Payer: Self-pay | Admitting: Cardiology

## 2024-03-27 VITALS — BP 122/86 | HR 86 | Ht 72.0 in | Wt 247.8 lb

## 2024-03-27 DIAGNOSIS — E7800 Pure hypercholesterolemia, unspecified: Secondary | ICD-10-CM | POA: Diagnosis not present

## 2024-03-27 DIAGNOSIS — R0989 Other specified symptoms and signs involving the circulatory and respiratory systems: Secondary | ICD-10-CM | POA: Diagnosis not present

## 2024-03-27 DIAGNOSIS — I2510 Atherosclerotic heart disease of native coronary artery without angina pectoris: Secondary | ICD-10-CM | POA: Diagnosis not present

## 2024-03-27 DIAGNOSIS — I10 Essential (primary) hypertension: Secondary | ICD-10-CM | POA: Diagnosis not present

## 2024-03-27 LAB — COMPREHENSIVE METABOLIC PANEL WITH GFR
ALT: 37 IU/L (ref 0–44)
AST: 25 IU/L (ref 0–40)
Albumin: 4.3 g/dL (ref 3.9–4.9)
Alkaline Phosphatase: 61 IU/L (ref 44–121)
BUN/Creatinine Ratio: 17 (ref 10–24)
BUN: 20 mg/dL (ref 8–27)
Bilirubin Total: 0.5 mg/dL (ref 0.0–1.2)
CO2: 24 mmol/L (ref 20–29)
Calcium: 9.4 mg/dL (ref 8.6–10.2)
Chloride: 104 mmol/L (ref 96–106)
Creatinine, Ser: 1.15 mg/dL (ref 0.76–1.27)
Globulin, Total: 1.9 g/dL (ref 1.5–4.5)
Glucose: 112 mg/dL — ABNORMAL HIGH (ref 70–99)
Potassium: 4.7 mmol/L (ref 3.5–5.2)
Sodium: 143 mmol/L (ref 134–144)
Total Protein: 6.2 g/dL (ref 6.0–8.5)
eGFR: 71 mL/min/{1.73_m2} (ref >59)

## 2024-03-27 LAB — LIPID PANEL
Chol/HDL Ratio: 2.1 ratio (ref 0.0–5.0)
Cholesterol, Total: 128 mg/dL (ref 100–199)
HDL: 61 mg/dL (ref >39)
LDL Chol Calc (NIH): 55 mg/dL (ref 0–99)
Triglycerides: 52 mg/dL (ref 0–149)
VLDL Cholesterol Cal: 12 mg/dL (ref 5–40)

## 2024-03-27 NOTE — Patient Instructions (Signed)
 Medication Instructions:  Continue same medications *If you need a refill on your cardiac medications before your next appointment, please call your pharmacy*  Lab Work: None ordered  Testing/Procedures: Schedule AAA Duplex  after 6/18  Follow-Up: At [facility], you and your health needs are our priority.  As part of our continuing mission to provide you with exceptional heart care, our providers are all part of one team.  This team includes your primary Cardiologist (physician) and Advanced Practice Providers or APPs (Physician Assistants and Nurse Practitioners) who all work together to provide you with the care you need, when you need it.  Your next appointment:  1 year   Call in Feb to schedule June appointment    Provider:  Lilian Register   We recommend signing up for the patient portal called "MyChart".  Sign up information is provided on this After Visit Summary.  MyChart is used to connect with patients for Virtual Visits (Telemedicine).  Patients are able to view lab/test results, encounter notes, upcoming appointments, etc.  Non-urgent messages can be sent to your provider as well.   To learn more about what you can do with MyChart, go to [email or web address].

## 2024-04-14 ENCOUNTER — Ambulatory Visit (HOSPITAL_COMMUNITY)
Admission: RE | Admit: 2024-04-14 | Discharge: 2024-04-14 | Disposition: A | Discharge status: Home / Self Care | Source: Ambulatory Visit | Attending: Cardiology | Admitting: Cardiology

## 2024-04-14 ENCOUNTER — Ambulatory Visit: Payer: Self-pay | Admitting: Cardiology

## 2024-04-14 DIAGNOSIS — R0989 Other specified symptoms and signs involving the circulatory and respiratory systems: Secondary | ICD-10-CM | POA: Diagnosis not present

## 2024-04-14 DIAGNOSIS — E7800 Pure hypercholesterolemia, unspecified: Secondary | ICD-10-CM

## 2024-04-14 DIAGNOSIS — I10 Essential (primary) hypertension: Secondary | ICD-10-CM

## 2024-04-14 DIAGNOSIS — I2510 Atherosclerotic heart disease of native coronary artery without angina pectoris: Secondary | ICD-10-CM

## 2024-06-08 ENCOUNTER — Emergency Department (HOSPITAL_BASED_OUTPATIENT_CLINIC_OR_DEPARTMENT_OTHER)

## 2024-06-08 ENCOUNTER — Encounter (HOSPITAL_BASED_OUTPATIENT_CLINIC_OR_DEPARTMENT_OTHER): Payer: Self-pay

## 2024-06-08 ENCOUNTER — Other Ambulatory Visit: Payer: Self-pay

## 2024-06-08 ENCOUNTER — Emergency Department (HOSPITAL_BASED_OUTPATIENT_CLINIC_OR_DEPARTMENT_OTHER)
Admission: EM | Admit: 2024-06-08 | Discharge: 2024-06-08 | Disposition: A | Discharge status: Home / Self Care | Attending: Emergency Medicine | Admitting: Emergency Medicine

## 2024-06-08 DIAGNOSIS — Z7982 Long term (current) use of aspirin: Secondary | ICD-10-CM | POA: Diagnosis not present

## 2024-06-08 DIAGNOSIS — S61215A Laceration without foreign body of left ring finger without damage to nail, initial encounter: Secondary | ICD-10-CM | POA: Diagnosis present

## 2024-06-08 DIAGNOSIS — W293XXA Contact with powered garden and outdoor hand tools and machinery, initial encounter: Secondary | ICD-10-CM | POA: Diagnosis not present

## 2024-06-08 NOTE — ED Triage Notes (Signed)
 Pt reports laceration to L ring finger from hedge trimmer. Pt finger intact. Bleeding controlled with bandage and coban.

## 2024-06-08 NOTE — ED Notes (Signed)
 This RN applied bacitracin with non-stick gauze and a finger splint to the patients finger

## 2024-06-08 NOTE — ED Provider Notes (Signed)
 [place] EMERGENCY DEPARTMENT AT [facility] Provider Note   CSN: [id number] Arrival date & time: 06/08/24  1639     Patient presents with: Laceration   Jeffrey Daniel is a 63 y.o. male.   Patient presents to the emergency department today for evaluation of left ring finger laceration.  Patient was using a Counsellor, states that he stepped on the power cord and it pulled the trimmer out of his right hand.  He states that the the trimmer struck the tip of his L ring finger.  Patient takes baby aspirin.  Last tetanus update 8 years ago.  Reports pain in the fingertip.  Bleeding controlled with pressure.       Prior to Admission medications   Medication Sig Start Date End Date Taking? Authorizing Provider  acetaminophen  (TYLENOL ) 325 MG tablet Take 650 mg by mouth every 6 (six) hours as needed for pain.    [provider]  amphetamine-dextroamphetamine (ADDERALL XR) 25 MG 24 hr capsule Take 25 mg by mouth every morning.    [provider]  aspirin EC 81 MG tablet Take 81 mg by mouth daily.      [provider]  hydrochlorothiazide (HYDRODIURIL) 25 MG tablet Take 1 tablet (25 mg total) by mouth daily. Patient not taking: Reported on 03/27/2024 12/26/23   Pietro Redell RAMAN, MD  losartan  (COZAAR ) 100 MG tablet Take 1 tablet (100 mg total) by mouth daily. 01/22/24   Pietro Redell RAMAN, MD  potassium chloride  SA (KLOR-CON  M) 20 MEQ tablet Take 1 tablet (20 mEq total) by mouth daily. 12/26/23 03/25/24  Pietro Redell RAMAN, MD  predniSONE  (DELTASONE ) 20 MG tablet 2 tabs po daily x 4 days Patient not taking: Reported on 02/19/2023 03/14/22   Lenor Hollering, MD  rosuvastatin  (CRESTOR ) 40 MG tablet Take 1 tablet (40 mg total) by mouth daily. 01/11/22 04/11/22  Pietro Redell RAMAN, MD    Allergies: Nsaids, Bupropion, Lisinopril, and Strattera  [atomoxetine hcl]    Review of Systems  Updated Vital Signs BP (!) 142/81   Pulse 96   Temp 97.8 F (36.6 C) (Tympanic)    Resp 18   Ht 6' (1.829 m)   Wt 111.1 kg   SpO2 100%   BMI 33.23 kg/m   Physical Exam Vitals and nursing note reviewed.  Constitutional:      Appearance: He is well-developed.  HENT:     Head: Normocephalic and atraumatic.  Eyes:     Conjunctiva/sclera: Conjunctivae normal.  Pulmonary:     Effort: No respiratory distress.  Musculoskeletal:     Cervical back: Normal range of motion and neck supple.     Comments: Left ring finger tip with abrasion, superficial nongaping laceration.  No active bleeding.  Skin:    General: Skin is warm and dry.  Neurological:     Mental Status: He is alert.     (all labs ordered are listed, but only abnormal results are displayed) Labs Reviewed - No data to display  EKG: None  Radiology: No results found.   Procedures   Medications Ordered in the ED - No data to display  ED Course  Patient seen and examined. History obtained directly from patient.   Labs/EKG: None ordered  Imaging: Ordered x-ray of the finger.  Medications/Fluids: None ordered  Most recent vital signs reviewed and are as follows: BP (!) 142/81   Pulse 96   Temp 97.8 F (36.6 C) (Tympanic)   Resp 18   Ht 6' (1.829  m)   Wt 111.1 kg   SpO2 100%   BMI 33.23 kg/m   Initial impression: Distal finger laceration, evaluate for fracture  6:13 PM Reassessment performed. Patient appears stable.  I cleaned the wound with skin cleanser earlier.  Patient without any gaping lacerations.  No flap.  At this point, would not suture and allow to heal in place since it is well-approximated.  Patient in agreement.  Imaging personally visualized and interpreted including: X-ray of the finger, agree no fracture.  Reviewed pertinent lab work and imaging with patient at bedside. Questions answered.   Most current vital signs reviewed and are as follows: BP (!) 142/81   Pulse 96   Temp 97.8 F (36.6 C) (Tympanic)   Resp 18   Ht 6' (1.829 m)   Wt 111.1 kg   SpO2 100%    BMI 33.23 kg/m   Plan: Discharge to home.  Bandaging and splint for protection.  Prescriptions written for: None  Other home care instructions discussed: Wound care, dressing change, wound cleaning twice a day.  ED return instructions discussed: New or worsening symptoms  Follow-up instructions discussed: Patient encouraged to follow-up with their PCP as needed.                                  Medical Decision Making Amount and/or Complexity of Data Reviewed Radiology: ordered.   Patient with distal finger laceration, wound is superficial.  Wound clean, wound nongaping.  I cannot separate the wound edges manually.  At this point, will bandage in place, splint for protection.  Sutures would cause more trauma to the tissue and not provide any additional benefit with wound approximation.  X-ray negative.  Tetanus up-to-date within 10 years.     Final diagnoses:  Laceration of left ring finger without foreign body without damage to nail, initial encounter    ED Discharge Orders     None          Desiderio Chew, PA-C 06/08/24 1817    Ruthe Cornet, DO 06/08/24 1858

## 2024-06-08 NOTE — Discharge Instructions (Signed)
 Please read and follow all provided instructions.  Your diagnoses today include:  1. Laceration of left ring finger without foreign body without damage to nail, initial encounter    Tests performed today include: X-ray of the affected area that did not show any foreign bodies or broken bones Vital signs. See below for your results today.   Medications prescribed:  Please use over-the-counter NSAID medications (ibuprofen, naproxen) or Tylenol  (acetaminophen ) as directed on the packaging for pain -- as long as you do not have any reasons avoid these medications. Reasons to avoid NSAID medications include: weak kidneys, a history of bleeding in your stomach or gut, or uncontrolled high blood pressure or previous heart attack. Reasons to avoid Tylenol  include: liver problems or ongoing alcohol use. Never take more than 4000mg  or 8 Extra strength Tylenol  in a 24 hour period.     Take any prescribed medications only as directed.   Home care instructions:  Follow any educational materials and wound care instructions contained in this packet.   Use splint for the next several days to protect the wound during the early stages of healing.  Keep affected area above the level of your heart when possible to minimize swelling. Wash area gently twice a day with warm soapy water. Do not apply alcohol or hydrogen peroxide. Cover the area if it draining or weeping.   Return instructions:  Return to the Emergency Department if you have: Fever Worsening pain Worsening swelling of the wound Pus draining from the wound Redness of the skin that moves away from the wound, especially if it streaks away from the affected area  Any other emergent concerns  Your vital signs today were: BP (!) 142/81   Pulse 96   Temp 97.8 F (36.6 C) (Tympanic)   Resp 18   Ht 6' (1.829 m)   Wt 111.1 kg   SpO2 100%   BMI 33.23 kg/m  If your blood pressure (BP) was elevated above 135/85 this visit, please have this  repeated by your doctor within one month. --------------

## 2024-11-17 ENCOUNTER — Other Ambulatory Visit: Payer: Self-pay | Admitting: Cardiology

## 2024-11-17 DIAGNOSIS — E876 Hypokalemia: Secondary | ICD-10-CM
